# Patient Record
Sex: Female | Born: 1985
Health system: Southern US, Community
[De-identification: ages and names within clinical notes are randomized; demographics above are authoritative.]

## PROBLEM LIST (undated history)

## (undated) DIAGNOSIS — D649 Anemia, unspecified: Secondary | ICD-10-CM

## (undated) HISTORY — DX: Anemia, unspecified: D64.9

## (undated) HISTORY — PX: NO PAST SURGERIES: SHX2092

---

## 2010-10-10 ENCOUNTER — Emergency Department (HOSPITAL_BASED_OUTPATIENT_CLINIC_OR_DEPARTMENT_OTHER)
Admission: EM | Admit: 2010-10-10 | Discharge: 2010-10-10 | Disposition: A | Discharge status: Home / Self Care | Payer: Self-pay | Attending: Emergency Medicine | Admitting: Emergency Medicine

## 2010-10-10 ENCOUNTER — Encounter: Payer: Self-pay | Admitting: *Deleted

## 2010-10-10 DIAGNOSIS — L259 Unspecified contact dermatitis, unspecified cause: Secondary | ICD-10-CM | POA: Insufficient documentation

## 2010-10-10 DIAGNOSIS — F17200 Nicotine dependence, unspecified, uncomplicated: Secondary | ICD-10-CM | POA: Insufficient documentation

## 2010-10-10 MED ORDER — PREDNISONE 10 MG PO TABS: 20.0000 mg | ORAL_TABLET | Freq: Every day | ORAL | Status: AC

## 2010-10-10 NOTE — ED Notes (Signed)
Pt c.o rash to face x 1 day.  

## 2010-10-10 NOTE — ED Provider Notes (Signed)
History     CSN: [id number] Arrival date & time: 10/10/2010  3:36 PM  Chief Complaint  Patient presents with  . Rash    HPI  (Consider location/radiation/quality/duration/timing/severity/associated sxs/prior treatment)  HPI Despite female presents with chief complaint a rash to her face. Rash occurred yesterday. It is very itchy. Has been treated with hydrocortisone. Patient denies exposure to plants are poison ivy. No new foods soaps or detergents. History reviewed. No pertinent past medical history.  History reviewed. No pertinent past surgical history.  History reviewed. No pertinent family history.  History  Substance Use Topics  . Smoking status: Current Everyday Smoker -- 0.5 packs/day  . Smokeless tobacco: Not on file  . Alcohol Use: No    OB History    Grav Para Term Preterm Abortions TAB SAB Ect Mult Living                  Review of Systems  Review of Systems  All other systems reviewed and are negative.    Allergies  Review of patient's allergies indicates no known allergies.  Home Medications  No current outpatient prescriptions on file.  Physical Exam    BP 125/75  Pulse 64  Temp(Src) 98.1 F (36.7 C) (Oral)  Resp 16  Ht 5\' 4"  (1.626 m)  Wt 130 lb (58.968 kg)  BMI 22.31 kg/m2  SpO2 100%  LMP 09/23/2010  Physical Exam  Nursing note and vitals reviewed. Constitutional: She is oriented to person, place, and time. She appears well-developed and well-nourished. No distress.  HENT:  Head: Normocephalic and atraumatic.  Eyes: Pupils are equal, round, and reactive to light.  Neck: Normal range of motion.  Cardiovascular: Normal rate and intact distal pulses.   Pulmonary/Chest: No respiratory distress.  Abdominal: Normal appearance. She exhibits no distension.  Musculoskeletal: Normal range of motion.  Neurological: She is alert and oriented to person, place, and time. No cranial nerve deficit.  Skin: Skin is warm and dry. Rash noted.   Rash consistent with a contact dermatitis. No evidence of infection or or cellulitis.  Psychiatric: She has a normal mood and affect. Her behavior is normal.    ED Course  Procedures (including critical care time)  Labs Reviewed - No data to display No results found.   No diagnosis found.   MDM         Nelia Shi, MD 10/10/10 [phone]

## 2011-01-20 NOTE — L&D Delivery Note (Signed)
Delivery Note   Ophelia, Sipe [[id number]]  At 6:59 AM a viable and healthy female was delivered via Vaginal, Spontaneous Delivery (Presentation: OA  ). Vigorous to awaiting RN at mother request APGAR: 9, 9; weight .   Placenta status: intact, by Veatrice Kells  Cord: 3 vessels with the following complications: None.  Anesthesia: Epidural  Episiotomy: None Lacerations: None Suture Repair: n/a Est. Blood Loss (mL): 350    Eran, Windish [place] [[id number]]  At 7:09 AM a viable and healthy female was delivered via Vaginal, Breech (Presentation: double footling ).  APGAR: 7, 8; weight .   Placenta status: Intact, Spontaneous by Veatrice Kells.  Cord: 3 vessels with the following complications: None.   Anesthesia: Epidural  Episiotomy: None Lacerations: None Suture Repair: None Est. Blood Loss (mL): 350   Mom to postpartum.   Baby A to nursery-stable.   Baby B to nursery-stable.  Dr. Emelda Fear present at delivery.  Jann Milkovich E. 04/27/2011, 7:29 AM

## 2011-03-23 LAB — RPR: RPR: NONREACTIVE

## 2011-03-23 LAB — GLUCOSE TOLERANCE, 1 HOUR: Glucose, 1 hour: 77

## 2011-03-23 LAB — CBC
HCT: 30 % — AB (ref 36–46)
Hemoglobin: 10 g/dL — AB (ref 12.0–16.0)

## 2011-03-23 LAB — CYTOLOGY - PAP: CYTOLOGY - PAP: NEGATIVE

## 2011-03-24 LAB — HIV ANTIBODY (ROUTINE TESTING W REFLEX): HIV: NONREACTIVE

## 2011-03-24 LAB — ANTIBODY SCREEN: Antibody Screen: NEGATIVE

## 2011-03-24 LAB — ABO/RH: RH Type: POSITIVE

## 2011-03-31 DIAGNOSIS — O0930 Supervision of pregnancy with insufficient antenatal care, unspecified trimester: Secondary | ICD-10-CM

## 2011-03-31 DIAGNOSIS — O30009 Twin pregnancy, unspecified number of placenta and unspecified number of amniotic sacs, unspecified trimester: Secondary | ICD-10-CM

## 2011-04-02 ENCOUNTER — Ambulatory Visit (INDEPENDENT_AMBULATORY_CARE_PROVIDER_SITE_OTHER): Payer: Medicaid Other | Admitting: Obstetrics and Gynecology

## 2011-04-02 VITALS — BP 116/78 | Temp 96.7°F | Ht 63.5 in | Wt 156.0 lb

## 2011-04-02 DIAGNOSIS — O0993 Supervision of high risk pregnancy, unspecified, third trimester: Secondary | ICD-10-CM

## 2011-04-02 DIAGNOSIS — O30009 Twin pregnancy, unspecified number of placenta and unspecified number of amniotic sacs, unspecified trimester: Secondary | ICD-10-CM

## 2011-04-02 DIAGNOSIS — O0930 Supervision of pregnancy with insufficient antenatal care, unspecified trimester: Secondary | ICD-10-CM

## 2011-04-02 LAB — POCT URINALYSIS DIP (DEVICE)
Hgb urine dipstick: NEGATIVE
Nitrite: NEGATIVE
Protein, ur: NEGATIVE mg/dL
pH: 6 (ref 5.0–8.0)

## 2011-04-02 NOTE — Progress Notes (Signed)
U/S 04/07/11 at 230 pm.

## 2011-04-02 NOTE — Progress Notes (Signed)
Pulse 105 Pt reports that she has had all labs at health department including 1hr glucose, pap smear and cultures.

## 2011-04-02 NOTE — Progress Notes (Incomplete)
Nutrition Note:  (1st clinic visit) Pt seen for Nutrition consult for initial [facility] visit. Pt dx. Twin gestation, smoker, anemia, poor weight gain, hx of asthma and late [facility]. Pt is [redacted]w[redacted]d gestation with inadequate wt gain of 21#, plots 9#< expected at 34w. Pt reports poor intake of 2-3 small meals daily, no N/V or food allergies reported.  Pt also reports that she sleeps a lot.  Disc wt gain goals of 37-54#.  Encouraged increasing intake by adding 2-3 small snacks.  Pt does not plan to breastfeed and does not receive [facility] services currently.  Made WIC appt for 04/14/11 @ 1:15. Follow up if referred.  Cy Blamer, RD

## 2011-04-02 NOTE — Progress Notes (Signed)
Patient doing well without complaints. Understands why she is transferred to [facility]. Risk of twin pregnancy explained. Awaiting lab results from health department. Will schedule anatomy ultrasound today. Patient planning on BTL for pp BCM. FM/PTL precautions reviewed.

## 2011-04-02 NOTE — Patient Instructions (Signed)
Multiple Pregnancy A multiple pregnancy is when a woman is pregnant with two or more fetuses. Multiple pregnancies occur in about 3% of all births. The more babies in a pregnancy, the greater the risks of problems to the babies and mother. This includes death. Since the use of Assisted Reproductive Technology (ART) and medications that can induce ovulation, multiple fetal gestation has increased.  RISKS TO THE MOTHER  Preeclampsia and eclampsia.   Postpartum bleeding (hemorrhage).   Kidney infection (pyelonephritis).   Develop anemia.   Develop diabetes.   Liver complications.   A blood clot blocks the artery, or branch of the artery leading to the lungs (pulmonary embolism).   Blood clots in the leg.   Placental separation.   Higher rate of Cesarean Section deliveries.   Women over 35 years old have a higher rate of Downs Syndrome babies.  RISKS TO THE BABY  Preterm labor with a premature baby.   Very low birth weight babies that are less than 3 pounds, especially with triplets or mores.   Premature rupture of the membranes.   Twin to twin blood transfusion with one baby anemic and the other baby with too much blood in its system. There may also be heart failure.   With triplets or more, one of the babies is at high risk for cerebral palsy or other neurologic problem.   There is a higher incidence of fetal death.  CARE OF MOTHERS WITH MULTIPLE FETAL GESTATION Multiple pregnancies need more care and special prenatal care.  You will see your caregiver more often.   You will have more tests including ultrasounds, nonstress tests and blood tests.   You will have special tests done called amniocentesis and a biophysical profile.   You may be hospitalized more often during the pregnancy.   You will be encouraged to eat a balanced and healthy diet with vitamin and mineral supplements as directed.   You will be asked to get more rest and sleep to keep up your energy.    You will be asked to restrict your daily activities, exercise, work, household chores and sexual activity.   If you have preterm labor with small babies, you will be given a steroid injection to help the babies lungs mature and do better when born.   The delivery may have to be by Cesarean delivery, especially if there are triplets or more.   The delivery should be in a hospital with an intensive care nursery and Neonatologists (pediatrician for high risk babies) to care for the newborn babies.  HOME CARE INSTRUCTIONS   Follow the caregiver's recommendations regarding office visits, tests for you and the babies, diet, rest and medications.   Avoid a large amount of physical activity.   Arrange to have help after the babies are born and when you go home from the hospital.   Take classes on how to care for multiple babies before you deliver them.  SEEK IMMEDIATE MEDICAL CARE IF:   You develop a temperature of 100.4 F (38 C) or higher.   You are leaking fluid from the vagina.   You develop vaginal bleeding.   You develop uterine contractions.   You develop a severe headache, severe upper abdominal pain, visual problems or excessive swelling of your face, hands and feet.   You develop severe back pain or leg pain.   You develop severe tiredness.   You develop chest pain.   You have shortness of breath, fall down or pass out.    Document Released: 10/15/2007 Document Revised: 12/25/2010 Document Reviewed: 10/15/2007 Exit[employer] Patient Information 2012 [employer]. 

## 2011-04-06 ENCOUNTER — Ambulatory Visit (INDEPENDENT_AMBULATORY_CARE_PROVIDER_SITE_OTHER): Payer: Medicaid Other | Admitting: *Deleted

## 2011-04-06 VITALS — BP 116/65 | Wt 162.2 lb

## 2011-04-06 DIAGNOSIS — O0930 Supervision of pregnancy with insufficient antenatal care, unspecified trimester: Secondary | ICD-10-CM

## 2011-04-06 DIAGNOSIS — O30009 Twin pregnancy, unspecified number of placenta and unspecified number of amniotic sacs, unspecified trimester: Secondary | ICD-10-CM

## 2011-04-06 LAB — FETAL NONSTRESS TEST

## 2011-04-06 NOTE — Progress Notes (Signed)
P = 102   Pt is scheduled for growth/anatomy US tomorrow.

## 2011-04-07 ENCOUNTER — Ambulatory Visit (HOSPITAL_COMMUNITY)
Admission: RE | Admit: 2011-04-07 | Discharge: 2011-04-07 | Disposition: A | Discharge status: Home / Self Care | Payer: Medicaid Other | Source: Ambulatory Visit | Attending: Obstetrics and Gynecology | Admitting: Obstetrics and Gynecology

## 2011-04-07 ENCOUNTER — Other Ambulatory Visit: Payer: Self-pay | Admitting: Obstetrics and Gynecology

## 2011-04-07 DIAGNOSIS — Z363 Encounter for antenatal screening for malformations: Secondary | ICD-10-CM | POA: Insufficient documentation

## 2011-04-07 DIAGNOSIS — O30009 Twin pregnancy, unspecified number of placenta and unspecified number of amniotic sacs, unspecified trimester: Secondary | ICD-10-CM

## 2011-04-07 DIAGNOSIS — O0930 Supervision of pregnancy with insufficient antenatal care, unspecified trimester: Secondary | ICD-10-CM

## 2011-04-07 DIAGNOSIS — Z1389 Encounter for screening for other disorder: Secondary | ICD-10-CM | POA: Insufficient documentation

## 2011-04-07 DIAGNOSIS — O358XX0 Maternal care for other (suspected) fetal abnormality and damage, not applicable or unspecified: Secondary | ICD-10-CM | POA: Insufficient documentation

## 2011-04-09 ENCOUNTER — Ambulatory Visit (INDEPENDENT_AMBULATORY_CARE_PROVIDER_SITE_OTHER): Payer: Medicaid Other | Admitting: Obstetrics and Gynecology

## 2011-04-09 ENCOUNTER — Other Ambulatory Visit: Payer: Medicaid Other

## 2011-04-09 VITALS — BP 113/70 | Temp 97.6°F | Wt 161.6 lb

## 2011-04-09 DIAGNOSIS — O0993 Supervision of high risk pregnancy, unspecified, third trimester: Secondary | ICD-10-CM

## 2011-04-09 DIAGNOSIS — O30009 Twin pregnancy, unspecified number of placenta and unspecified number of amniotic sacs, unspecified trimester: Secondary | ICD-10-CM

## 2011-04-09 DIAGNOSIS — O0930 Supervision of pregnancy with insufficient antenatal care, unspecified trimester: Secondary | ICD-10-CM

## 2011-04-09 LAB — POCT URINALYSIS DIP (DEVICE)
Hgb urine dipstick: NEGATIVE
Nitrite: NEGATIVE
Urobilinogen, UA: 0.2 mg/dL (ref 0.0–1.0)
pH: 6.5 (ref 5.0–8.0)

## 2011-04-09 LAB — FETAL NONSTRESS TEST

## 2011-04-09 NOTE — Progress Notes (Signed)
Patient c/o cramping pain. cx 1/long/posterior/ moderate tone. Limited anatomy due to advance gestational age but otherwise normal visualized anatomy. FM/PTL precautions reviewed

## 2011-04-09 NOTE — Progress Notes (Signed)
Pulse: 99 

## 2011-04-10 ENCOUNTER — Encounter: Payer: Self-pay | Admitting: *Deleted

## 2011-04-13 ENCOUNTER — Other Ambulatory Visit: Payer: Self-pay | Admitting: Physician Assistant

## 2011-04-13 ENCOUNTER — Ambulatory Visit (INDEPENDENT_AMBULATORY_CARE_PROVIDER_SITE_OTHER): Payer: Medicaid Other | Admitting: *Deleted

## 2011-04-13 VITALS — BP 104/67 | Wt 157.3 lb

## 2011-04-13 DIAGNOSIS — O30009 Twin pregnancy, unspecified number of placenta and unspecified number of amniotic sacs, unspecified trimester: Secondary | ICD-10-CM

## 2011-04-13 DIAGNOSIS — G4700 Insomnia, unspecified: Secondary | ICD-10-CM

## 2011-04-13 LAB — FETAL NONSTRESS TEST

## 2011-04-13 MED ORDER — ZOLPIDEM TARTRATE 10 MG PO TABS: 10.0000 mg | ORAL_TABLET | Freq: Every evening | ORAL | Status: AC | PRN

## 2011-04-13 NOTE — Progress Notes (Addendum)
P = 125  Pt reports pain in lower abdomen and requesting cervical exam today - performed by Maylon Cos CNM.  Labor sx reviewed.  NST reactive

## 2011-04-14 ENCOUNTER — Encounter: Payer: Self-pay | Admitting: Obstetrics and Gynecology

## 2011-04-16 ENCOUNTER — Ambulatory Visit (INDEPENDENT_AMBULATORY_CARE_PROVIDER_SITE_OTHER): Payer: Medicaid Other | Admitting: Obstetrics & Gynecology

## 2011-04-16 VITALS — BP 110/67 | Temp 97.1°F | Wt 159.4 lb

## 2011-04-16 DIAGNOSIS — O30009 Twin pregnancy, unspecified number of placenta and unspecified number of amniotic sacs, unspecified trimester: Secondary | ICD-10-CM

## 2011-04-16 LAB — POCT URINALYSIS DIP (DEVICE)
Glucose, UA: NEGATIVE mg/dL
Hgb urine dipstick: NEGATIVE
Protein, ur: NEGATIVE mg/dL
Specific Gravity, Urine: 1.015 (ref 1.005–1.030)
Urobilinogen, UA: 1 mg/dL (ref 0.0–1.0)

## 2011-04-16 NOTE — Progress Notes (Signed)
[place] on 3/20--38% and 20% (11% dis concordant).  PT for BTL.  Papers are in EPIC.  GBS today.  GC and Chlam were tested earlier this month.  NST reactive x2.

## 2011-04-16 NOTE — Progress Notes (Signed)
Addended by: Loraine Bhullar H on: 04/16/2011 12:08 PM   Modules accepted: Orders  

## 2011-04-16 NOTE — Progress Notes (Signed)
P = 125 

## 2011-04-16 NOTE — Progress Notes (Signed)
Needs gbs.

## 2011-04-16 NOTE — Patient Instructions (Signed)
Breastfeeding BENEFITS OF BREASTFEEDING For the baby  The first milk (colostrum) helps the baby's digestive system function better.   There are antibodies from the mother in the milk that help the baby fight off infections.   The baby has a lower incidence of asthma, allergies, and SIDS (sudden infant death syndrome).   The nutrients in breast milk are better than formulas for the baby and helps the baby's brain grow better.   Babies who breastfeed have less gas, colic, and constipation.  For the mother  Breastfeeding helps develop a very special bond between mother and baby.   It is more convenient, always available at the correct temperature and cheaper than formula feeding.   It burns calories in the mother and helps with losing weight that was gained during pregnancy.   It makes the uterus contract back down to normal size faster and slows bleeding following delivery.   Breastfeeding mothers have a lower risk of developing breast cancer.  NURSE FREQUENTLY  A healthy, full-term baby may breastfeed as often as every hour or space his or her feedings to every 3 hours.   How often to nurse will vary from baby to baby. Watch your baby for signs of hunger, not the clock.   Nurse as often as the baby requests, or when you feel the need to reduce the fullness of your breasts.   Awaken the baby if it has been 3 to 4 hours since the last feeding.   Frequent feeding will help the mother make more milk and will prevent problems like sore nipples and engorgement of the breasts.  BABY'S POSITION AT THE BREAST  Whether lying down or sitting, be sure that the baby's tummy is facing your tummy.   Support the breast with 4 fingers underneath the breast and the thumb above. Make sure your fingers are well away from the nipple and baby's mouth.   Stroke the baby's lips and cheek closest to the breast gently with your finger or nipple.   When the baby's mouth is open wide enough, place all  of your nipple and as much of the dark area around the nipple as possible into your baby's mouth.   Pull the baby in close so the tip of the nose and the baby's cheeks touch the breast during the feeding.  FEEDINGS  The length of each feeding varies from baby to baby and from feeding to feeding.   The baby must suck about 2 to 3 minutes for your milk to get to him or her. This is called a "let down." For this reason, allow the baby to feed on each breast as long as he or she wants. Your baby will end the feeding when he or she has received the right balance of nutrients.   To break the suction, put your finger into the corner of the baby's mouth and slide it between his or her gums before removing your breast from his or her mouth. This will help prevent sore nipples.  REDUCING BREAST ENGORGEMENT  In the first week after your baby is born, you may experience signs of breast engorgement. When breasts are engorged, they feel heavy, warm, full, and may be tender to the touch. You can reduce engorgement if you:   Nurse frequently, every 2 to 3 hours. Mothers who breastfeed early and often have fewer problems with engorgement.   Place light ice packs on your breasts between feedings. This reduces swelling. Wrap the ice packs in a   lightweight towel to protect your skin.   Apply moist hot packs to your breast for 5 to 10 minutes before each feeding. This increases circulation and helps the milk flow.   Gently massage your breast before and during the feeding.   Make sure that the baby empties at least one breast at every feeding before switching sides.   Use a breast pump to empty the breasts if your baby is sleepy or not nursing well. You may also want to pump if you are returning to work or or you feel you are getting engorged.   Avoid bottle feeds, pacifiers or supplemental feedings of water or juice in place of breastfeeding.   Be sure the baby is latched on and positioned properly while  breastfeeding.   Prevent fatigue, stress, and anemia.   Wear a supportive bra, avoiding underwire styles.   Eat a balanced diet with enough fluids.  If you follow these suggestions, your engorgement should improve in 24 to 48 hours. If you are still experiencing difficulty, call your lactation consultant or caregiver. IS MY BABY GETTING ENOUGH MILK? Sometimes, mothers worry about whether their babies are getting enough milk. You can be assured that your baby is getting enough milk if:  The baby is actively sucking and you hear swallowing.   The baby nurses at least 8 to 12 times in a 24 hour time period. Nurse your baby until he or she unlatches or falls asleep at the first breast (at least 10 to 20 minutes), then offer the second side.   The baby is wetting 5 to 6 disposable diapers (6 to 8 cloth diapers) in a 24 hour period by 5 to 6 days of age.   The baby is having at least 2 to 3 stools every 24 hours for the first few months. Breast milk is all the food your baby needs. It is not necessary for your baby to have water or formula. In fact, to help your breasts make more milk, it is best not to give your baby supplemental feedings during the early weeks.   The stool should be soft and yellow.   The baby should gain 4 to 7 ounces per week after he is 4 days old.  TAKE CARE OF YOURSELF Take care of your breasts by:  Bathing or showering daily.   Avoiding the use of soaps on your nipples.   Start feedings on your left breast at one feeding and on your right breast at the next feeding.   You will notice an increase in your milk supply 2 to 5 days after delivery. You may feel some discomfort from engorgement, which makes your breasts very firm and often tender. Engorgement "peaks" out within 24 to 48 hours. In the meantime, apply warm moist towels to your breasts for 5 to 10 minutes before feeding. Gentle massage and expression of some milk before feeding will soften your breasts, making  it easier for your baby to latch on. Wear a well fitting nursing bra and air dry your nipples for 10 to 15 minutes after each feeding.   Only use cotton bra pads.   Only use pure lanolin on your nipples after nursing. You do not need to wash it off before nursing.  Take care of yourself by:   Eating well-balanced meals and nutritious snacks.   Drinking milk, fruit juice, and water to satisfy your thirst (about 8 glasses a day).   Getting plenty of rest.   Increasing calcium in   your diet (1200 mg a day).   Avoiding foods that you notice affect the baby in a bad way.  SEEK MEDICAL CARE IF:   You have any questions or difficulty with breastfeeding.   You need help.   You have a hard, red, sore area on your breast, accompanied by a fever of 100.5 F (38.1 C) or more.   Your baby is too sleepy to eat well or is having trouble sleeping.   Your baby is wetting less than 6 diapers per day, by 5 days of age.   Your baby's skin or white part of his or her eyes is more yellow than it was in the hospital.   You feel depressed.  Document Released: 01/05/2005 Document Revised: 12/25/2010 Document Reviewed: 08/20/2008 Exit[employer] Patient Information 2012 [employer]. 

## 2011-04-20 ENCOUNTER — Ambulatory Visit (INDEPENDENT_AMBULATORY_CARE_PROVIDER_SITE_OTHER): Payer: Self-pay | Admitting: *Deleted

## 2011-04-20 VITALS — BP 111/62 | Wt 163.5 lb

## 2011-04-20 DIAGNOSIS — O30009 Twin pregnancy, unspecified number of placenta and unspecified number of amniotic sacs, unspecified trimester: Secondary | ICD-10-CM

## 2011-04-20 LAB — CULTURE, BETA STREP (GROUP B ONLY)

## 2011-04-20 NOTE — Progress Notes (Signed)
P=99 

## 2011-04-20 NOTE — Progress Notes (Signed)
NST performed on 04/20/2011 was reviewed and was found to be reactive x 2 .  Continue recommended antenatal testing and prenatal care.

## 2011-04-23 ENCOUNTER — Encounter: Payer: Self-pay | Admitting: Advanced Practice Midwife

## 2011-04-23 ENCOUNTER — Ambulatory Visit (INDEPENDENT_AMBULATORY_CARE_PROVIDER_SITE_OTHER): Payer: Self-pay | Admitting: Family Medicine

## 2011-04-23 VITALS — BP 129/86 | Temp 97.3°F | Wt 162.7 lb

## 2011-04-23 DIAGNOSIS — O30009 Twin pregnancy, unspecified number of placenta and unspecified number of amniotic sacs, unspecified trimester: Secondary | ICD-10-CM

## 2011-04-23 LAB — POCT URINALYSIS DIP (DEVICE)
Bilirubin Urine: NEGATIVE
Glucose, UA: NEGATIVE mg/dL
Ketones, ur: NEGATIVE mg/dL
Leukocytes, UA: NEGATIVE
pH: 6.5 (ref 5.0–8.0)

## 2011-04-23 NOTE — Progress Notes (Signed)
NST reviewed and reactive x 2. ? In labor, will walk and recheck in 1 hour Schedule IOL if not in labor

## 2011-04-23 NOTE — Progress Notes (Signed)
Pt reports increased strength of UC's since 2130 last night- requests cervical exam.

## 2011-04-23 NOTE — Progress Notes (Signed)
Pelvic pressure. No vaginal discharge.  

## 2011-04-23 NOTE — Patient Instructions (Addendum)
Normal Labor and Delivery Your caregiver must first be sure you are in labor. Signs of labor include:  You may pass what is called "the mucus plug" before labor begins. This is a small amount of blood stained mucus.   Regular uterine contractions.   The time between contractions get closer together.   The discomfort and pain gradually gets more intense.   Pains are mostly located in the back.   Pains get worse when walking.   The cervix (the opening of the uterus becomes thinner (begins to efface) and opens up (dilates).  Once you are in labor and admitted into the hospital or care center, your caregiver will do the following:  A complete physical examination.   Check your vital signs (blood pressure, pulse, temperature and the fetal heart rate).   Do a vaginal examination (using a sterile glove and lubricant) to determine:   The position (presentation) of the baby (head [vertex] or buttock first).   The level (station) of the baby's head in the birth canal.   The effacement and dilatation of the cervix.   You may have your pubic hair shaved and be given an enema depending on your caregiver and the circumstance.   An electronic monitor is usually placed on your abdomen. The monitor follows the length and intensity of the contractions, as well as the baby's heart rate.   Usually, your caregiver will insert an IV in your arm with a bottle of sugar water. This is done as a precaution so that medications can be given to you quickly during labor or delivery.  NORMAL LABOR AND DELIVERY IS DIVIDED UP INTO 3 STAGES: First Stage This is when regular contractions begin and the cervix begins to efface and dilate. This stage can last from 3 to 15 hours. The end of the first stage is when the cervix is 100% effaced and 10 centimeters dilated. Pain medications may be given by   Injection (morphine, demerol, etc.)   Regional anesthesia (spinal, caudal or epidural, anesthetics given in  different locations of the spine). Paracervical pain medication may be given, which is an injection of and anesthetic on each side of the cervix.  A pregnant woman may request to have "Natural Childbirth" which is not to have any medications or anesthesia during her labor and delivery. Second Stage This is when the baby comes down through the birth canal (vagina) and is born. This can take 1 to 4 hours. As the baby's head comes down through the birth canal, you may feel like you are going to have a bowel movement. You will get the urge to bear down and push until the baby is delivered. As the baby's head is being delivered, the caregiver will decide if an episiotomy (a cut in the perineum and vagina area) is needed to prevent tearing of the tissue in this area. The episiotomy is sewn up after the delivery of the baby and placenta. Sometimes a mask with nitrous oxide is given for the mother to breath during the delivery of the baby to help if there is too much pain. The end of Stage 2 is when the baby is fully delivered. Then when the umbilical cord stops pulsating it is clamped and cut. Third Stage The third stage begins after the baby is completely delivered and ends after the placenta (afterbirth) is delivered. This usually takes 5 to 30 minutes. After the placenta is delivered, a medication is given either by intravenous or injection to help contract   the uterus and prevent bleeding. The third stage is not painful and pain medication is usually not necessary. If an episiotomy was done, it is repaired at this time. After the delivery, the mother is watched and monitored closely for 1 to 2 hours to make sure there is no postpartum bleeding (hemorrhage). If there is a lot of bleeding, medication is given to contract the uterus and stop the bleeding. Document Released: 10/15/2007 Document Revised: 12/25/2010 Document Reviewed: 10/15/2007 ExitCare Patient Information 2012 [employer]. Breastfeeding BENEFITS OF BREASTFEEDING For the baby  The first milk (colostrum) helps the baby's digestive system function better.   There are antibodies from the mother in the milk that help the baby fight off infections.   The baby has a lower incidence of asthma, allergies, and SIDS (sudden infant death syndrome).   The nutrients in breast milk are better than formulas for the baby and helps the baby's brain grow better.   Babies who breastfeed have less gas, colic, and constipation.  For the mother  Breastfeeding helps develop a very special bond between mother and baby.   It is more convenient, always available at the correct temperature and cheaper than formula feeding.   It burns calories in the mother and helps with losing weight that was gained during pregnancy.   It makes the uterus contract back down to normal size faster and slows bleeding following delivery.   Breastfeeding mothers have a lower risk of developing breast cancer.  NURSE FREQUENTLY  A healthy, full-term baby may breastfeed as often as every hour or space his or her feedings to every 3 hours.   How often to nurse will vary from baby to baby. Watch your baby for signs of hunger, not the clock.   Nurse as often as the baby requests, or when you feel the need to reduce the fullness of your breasts.   Awaken the baby if it has been 3 to 4 hours since the last feeding.   Frequent feeding will help the mother make more milk and will prevent problems like sore nipples and engorgement of the breasts.  BABY'S POSITION AT THE BREAST  Whether lying down or sitting, be sure that the baby's tummy is facing your tummy.   Support the breast with 4 fingers underneath the breast and the thumb above. Make sure your fingers are well away from the nipple and baby's mouth.   Stroke the baby's lips and cheek closest to the breast gently with your finger or nipple.   When the baby's mouth is open wide enough,  place all of your nipple and as much of the dark area around the nipple as possible into your baby's mouth.   Pull the baby in close so the tip of the nose and the baby's cheeks touch the breast during the feeding.  FEEDINGS  The length of each feeding varies from baby to baby and from feeding to feeding.   The baby must suck about 2 to 3 minutes for your milk to get to him or her. This is called a "let down." For this reason, allow the baby to feed on each breast as long as he or she wants. Your baby will end the feeding when he or she has received the right balance of nutrients.   To break the suction, put your finger into the corner of the baby's mouth and slide it between his or her gums before removing your breast from his or her mouth. This will   help prevent sore nipples.  REDUCING BREAST ENGORGEMENT  In the first week after your baby is born, you may experience signs of breast engorgement. When breasts are engorged, they feel heavy, warm, full, and may be tender to the touch. You can reduce engorgement if you:   Nurse frequently, every 2 to 3 hours. Mothers who breastfeed early and often have fewer problems with engorgement.   Place light ice packs on your breasts between feedings. This reduces swelling. Wrap the ice packs in a lightweight towel to protect your skin.   Apply moist hot packs to your breast for 5 to 10 minutes before each feeding. This increases circulation and helps the milk flow.   Gently massage your breast before and during the feeding.   Make sure that the baby empties at least one breast at every feeding before switching sides.   Use a breast pump to empty the breasts if your baby is sleepy or not nursing well. You may also want to pump if you are returning to work or or you feel you are getting engorged.   Avoid bottle feeds, pacifiers or supplemental feedings of water or juice in place of breastfeeding.   Be sure the baby is latched on and positioned properly  while breastfeeding.   Prevent fatigue, stress, and anemia.   Wear a supportive bra, avoiding underwire styles.   Eat a balanced diet with enough fluids.  If you follow these suggestions, your engorgement should improve in 24 to 48 hours. If you are still experiencing difficulty, call your lactation consultant or caregiver. IS MY BABY GETTING ENOUGH MILK? Sometimes, mothers worry about whether their babies are getting enough milk. You can be assured that your baby is getting enough milk if:  The baby is actively sucking and you hear swallowing.   The baby nurses at least 8 to 12 times in a 24 hour time period. Nurse your baby until he or she unlatches or falls asleep at the first breast (at least 10 to 20 minutes), then offer the second side.   The baby is wetting 5 to 6 disposable diapers (6 to 8 cloth diapers) in a 24 hour period by 5 to 6 days of age.   The baby is having at least 2 to 3 stools every 24 hours for the first few months. Breast milk is all the food your baby needs. It is not necessary for your baby to have water or formula. In fact, to help your breasts make more milk, it is best not to give your baby supplemental feedings during the early weeks.   The stool should be soft and yellow.   The baby should gain 4 to 7 ounces per week after he is 4 days old.  TAKE CARE OF YOURSELF Take care of your breasts by:  Bathing or showering daily.   Avoiding the use of soaps on your nipples.   Start feedings on your left breast at one feeding and on your right breast at the next feeding.   You will notice an increase in your milk supply 2 to 5 days after delivery. You may feel some discomfort from engorgement, which makes your breasts very firm and often tender. Engorgement "peaks" out within 24 to 48 hours. In the meantime, apply warm moist towels to your breasts for 5 to 10 minutes before feeding. Gentle massage and expression of some milk before feeding will soften your breasts,  making it easier for your baby to latch on. Wear a   well fitting nursing bra and air dry your nipples for 10 to 15 minutes after each feeding.   Only use cotton bra pads.   Only use pure lanolin on your nipples after nursing. You do not need to wash it off before nursing.  Take care of yourself by:   Eating well-balanced meals and nutritious snacks.   Drinking milk, fruit juice, and water to satisfy your thirst (about 8 glasses a day).   Getting plenty of rest.   Increasing calcium in your diet (1200 mg a day).   Avoiding foods that you notice affect the baby in a bad way.  SEEK MEDICAL CARE IF:   You have any questions or difficulty with breastfeeding.   You need help.   You have a hard, red, sore area on your breast, accompanied by a fever of 100.5 F (38.1 C) or more.   Your baby is too sleepy to eat well or is having trouble sleeping.   Your baby is wetting less than 6 diapers per day, by 5 days of age.   Your baby's skin or white part of his or her eyes is more yellow than it was in the hospital.   You feel depressed.  Document Released: 01/05/2005 Document Revised: 12/25/2010 Document Reviewed: 08/20/2008 ExitCare Patient Information 2012 [employer]. 

## 2011-04-23 NOTE — Progress Notes (Signed)
Sent walking. Returned at [employer]. Minimal change in VE. Trickle of fluid seen on perineum. Fern neg. Sent home w/ labor precautions.

## 2011-04-24 ENCOUNTER — Telehealth: Payer: Self-pay | Admitting: *Deleted

## 2011-04-24 NOTE — Telephone Encounter (Signed)
Received call transferred from front desk. Patient called stating she was here yesterday and had her cervix checked twice and last night had a little vaginal bleeding, but none since. States this am has been having mucousy discharge. Denies any further bleeding, denies any vaginal itching/discomfort, denies mal odor to discharge. Instructed patient  A little bleeding after cervix checks is normal, but if she has any further bleeding to come to MAU. Also discussed if vaginal discharge becomes malodorous, has discomfort or changes color to come to mau. Also discussed if has any leakage of water like fluid, or baby not moving normally come to hospital to MAU.  Patient states still having contractrions about every 8 minutes, discussed to come to hospital when contractions are 5 minutes apart or too painful . Patient voices understanding.

## 2011-04-27 ENCOUNTER — Encounter (HOSPITAL_COMMUNITY): Payer: Self-pay | Admitting: *Deleted

## 2011-04-27 ENCOUNTER — Inpatient Hospital Stay (HOSPITAL_COMMUNITY)
Admission: AD | Admit: 2011-04-27 | Discharge: 2011-04-29 | DRG: 767 | Disposition: A | Discharge status: Home / Self Care | Payer: Self-pay | Attending: Obstetrics and Gynecology | Admitting: Obstetrics and Gynecology

## 2011-04-27 ENCOUNTER — Encounter (HOSPITAL_COMMUNITY): Payer: Self-pay | Admitting: Anesthesiology

## 2011-04-27 ENCOUNTER — Inpatient Hospital Stay (HOSPITAL_COMMUNITY): Payer: Self-pay | Admitting: Anesthesiology

## 2011-04-27 ENCOUNTER — Other Ambulatory Visit: Payer: Self-pay

## 2011-04-27 DIAGNOSIS — O30009 Twin pregnancy, unspecified number of placenta and unspecified number of amniotic sacs, unspecified trimester: Principal | ICD-10-CM | POA: Diagnosis present

## 2011-04-27 DIAGNOSIS — O0930 Supervision of pregnancy with insufficient antenatal care, unspecified trimester: Secondary | ICD-10-CM

## 2011-04-27 DIAGNOSIS — Z302 Encounter for sterilization: Secondary | ICD-10-CM

## 2011-04-27 DIAGNOSIS — O30049 Twin pregnancy, dichorionic/diamniotic, unspecified trimester: Secondary | ICD-10-CM

## 2011-04-27 DIAGNOSIS — O0940 Supervision of pregnancy with grand multiparity, unspecified trimester: Secondary | ICD-10-CM

## 2011-04-27 DIAGNOSIS — O0993 Supervision of high risk pregnancy, unspecified, third trimester: Secondary | ICD-10-CM

## 2011-04-27 LAB — CBC
HCT: 30.7 % — ABNORMAL LOW (ref 36.0–46.0)
Hemoglobin: 10.1 g/dL — ABNORMAL LOW (ref 12.0–15.0)
MCH: 28.7 pg (ref 26.0–34.0)
MCV: 87.2 fL (ref 78.0–100.0)
RBC: 3.52 MIL/uL — ABNORMAL LOW (ref 3.87–5.11)

## 2011-04-27 MED ORDER — ONDANSETRON HCL 4 MG/2ML IJ SOLN: 4.0000 mg | INTRAMUSCULAR | Status: DC | PRN

## 2011-04-27 MED ORDER — DIBUCAINE 1 % RE OINT: 1.0000 "application " | TOPICAL_OINTMENT | RECTAL | Status: DC | PRN

## 2011-04-27 MED ORDER — OXYCODONE-ACETAMINOPHEN 5-325 MG PO TABS: 1.0000 | ORAL_TABLET | ORAL | Status: DC | PRN

## 2011-04-27 MED ORDER — LIDOCAINE HCL (PF) 1 % IJ SOLN
INTRAMUSCULAR | Status: DC | PRN
  Administered 2011-04-27: 3 mL
  Administered 2011-04-27: 4 mL

## 2011-04-27 MED ORDER — SENNOSIDES-DOCUSATE SODIUM 8.6-50 MG PO TABS
2.0000 | ORAL_TABLET | Freq: Every day | ORAL | Status: DC
  Administered 2011-04-27 – 2011-04-28 (×2): 2 via ORAL

## 2011-04-27 MED ORDER — WITCH HAZEL-GLYCERIN EX PADS: 1.0000 "application " | MEDICATED_PAD | CUTANEOUS | Status: DC | PRN

## 2011-04-27 MED ORDER — DIPHENHYDRAMINE HCL 25 MG PO CAPS: 25.0000 mg | ORAL_CAPSULE | Freq: Four times a day (QID) | ORAL | Status: DC | PRN

## 2011-04-27 MED ORDER — ONDANSETRON HCL 4 MG/2ML IJ SOLN: 4.0000 mg | Freq: Four times a day (QID) | INTRAMUSCULAR | Status: DC | PRN

## 2011-04-27 MED ORDER — FLEET ENEMA 7-19 GM/118ML RE ENEM: 1.0000 | ENEMA | RECTAL | Status: DC | PRN

## 2011-04-27 MED ORDER — IBUPROFEN 600 MG PO TABS: 600.0000 mg | ORAL_TABLET | Freq: Four times a day (QID) | ORAL | Status: DC | PRN

## 2011-04-27 MED ORDER — EPHEDRINE 5 MG/ML INJ
10.0000 mg | INTRAVENOUS | Status: DC | PRN
  Filled 2011-04-27: qty 4
  Filled 2011-04-27: qty 2

## 2011-04-27 MED ORDER — LACTATED RINGERS IV SOLN: 500.0000 mL | Freq: Once | INTRAVENOUS | Status: DC

## 2011-04-27 MED ORDER — FENTANYL 2.5 MCG/ML BUPIVACAINE 1/10 % EPIDURAL INFUSION (WH - ANES)
INTRAMUSCULAR | Status: DC | PRN
  Administered 2011-04-27: 12 mL/h via EPIDURAL

## 2011-04-27 MED ORDER — LIDOCAINE HCL (PF) 1 % IJ SOLN
30.0000 mL | INTRAMUSCULAR | Status: DC | PRN
  Administered 2011-04-27: 30 mL via SUBCUTANEOUS
  Filled 2011-04-27: qty 30

## 2011-04-27 MED ORDER — PHENYLEPHRINE 40 MCG/ML (10ML) SYRINGE FOR IV PUSH (FOR BLOOD PRESSURE SUPPORT)
80.0000 ug | PREFILLED_SYRINGE | INTRAVENOUS | Status: DC | PRN
  Filled 2011-04-27: qty 5
  Filled 2011-04-27: qty 2

## 2011-04-27 MED ORDER — ONDANSETRON HCL 4 MG PO TABS: 4.0000 mg | ORAL_TABLET | ORAL | Status: DC | PRN

## 2011-04-27 MED ORDER — FAMOTIDINE 20 MG PO TABS
40.0000 mg | ORAL_TABLET | Freq: Once | ORAL | Status: AC
  Administered 2011-04-28: 40 mg via ORAL
  Filled 2011-04-27: qty 2

## 2011-04-27 MED ORDER — TETANUS-DIPHTH-ACELL PERTUSSIS 5-2.5-18.5 LF-MCG/0.5 IM SUSP
0.5000 mL | Freq: Once | INTRAMUSCULAR | Status: DC
  Filled 2011-04-27: qty 0.5

## 2011-04-27 MED ORDER — METOCLOPRAMIDE HCL 10 MG PO TABS
10.0000 mg | ORAL_TABLET | Freq: Once | ORAL | Status: AC
  Administered 2011-04-28: 10 mg via ORAL
  Filled 2011-04-27: qty 1

## 2011-04-27 MED ORDER — EPHEDRINE 5 MG/ML INJ
10.0000 mg | INTRAVENOUS | Status: DC | PRN
  Filled 2011-04-27: qty 2

## 2011-04-27 MED ORDER — PRENATAL MULTIVITAMIN CH
1.0000 | ORAL_TABLET | Freq: Every day | ORAL | Status: DC
  Administered 2011-04-29: 1 via ORAL
  Filled 2011-04-27: qty 1

## 2011-04-27 MED ORDER — LACTATED RINGERS IV SOLN
INTRAVENOUS | Status: DC
  Administered 2011-04-27: 05:00:00 via INTRAVENOUS

## 2011-04-27 MED ORDER — DIPHENHYDRAMINE HCL 50 MG/ML IJ SOLN: 12.5000 mg | INTRAMUSCULAR | Status: DC | PRN

## 2011-04-27 MED ORDER — SIMETHICONE 80 MG PO CHEW: 80.0000 mg | CHEWABLE_TABLET | ORAL | Status: DC | PRN

## 2011-04-27 MED ORDER — ACETAMINOPHEN 325 MG PO TABS: 650.0000 mg | ORAL_TABLET | ORAL | Status: DC | PRN

## 2011-04-27 MED ORDER — OXYTOCIN BOLUS FROM INFUSION
500.0000 mL | Freq: Once | INTRAVENOUS | Status: DC
  Filled 2011-04-27: qty 1000
  Filled 2011-04-27: qty 500

## 2011-04-27 MED ORDER — LACTATED RINGERS IV SOLN: INTRAVENOUS | Status: DC

## 2011-04-27 MED ORDER — LACTATED RINGERS IV SOLN: 500.0000 mL | INTRAVENOUS | Status: DC | PRN

## 2011-04-27 MED ORDER — OXYTOCIN 20 UNITS IN LACTATED RINGERS INFUSION - SIMPLE
125.0000 mL/h | Freq: Once | INTRAVENOUS | Status: AC
  Administered 2011-04-27: 125 mL/h via INTRAVENOUS

## 2011-04-27 MED ORDER — FENTANYL 2.5 MCG/ML BUPIVACAINE 1/10 % EPIDURAL INFUSION (WH - ANES)
14.0000 mL/h | INTRAMUSCULAR | Status: DC
  Administered 2011-04-27: 14 mL/h via EPIDURAL
  Filled 2011-04-27 (×2): qty 60

## 2011-04-27 MED ORDER — BENZOCAINE-MENTHOL 20-0.5 % EX AERO: 1.0000 "application " | INHALATION_SPRAY | CUTANEOUS | Status: DC | PRN

## 2011-04-27 MED ORDER — PHENYLEPHRINE 40 MCG/ML (10ML) SYRINGE FOR IV PUSH (FOR BLOOD PRESSURE SUPPORT)
80.0000 ug | PREFILLED_SYRINGE | INTRAVENOUS | Status: DC | PRN
  Filled 2011-04-27: qty 2

## 2011-04-27 MED ORDER — IBUPROFEN 600 MG PO TABS
600.0000 mg | ORAL_TABLET | Freq: Four times a day (QID) | ORAL | Status: DC
  Administered 2011-04-27 – 2011-04-29 (×7): 600 mg via ORAL
  Filled 2011-04-27 (×6): qty 1

## 2011-04-27 MED ORDER — CITRIC ACID-SODIUM CITRATE 334-500 MG/5ML PO SOLN
30.0000 mL | ORAL | Status: DC | PRN
  Filled 2011-04-27: qty 15

## 2011-04-27 NOTE — H&P (Signed)
Chief Complaint: Active Labor   Amy Mcdonald is  26 y.o. [phone].  Patient's last menstrual period was 08/07/2010..[redacted]w[redacted]d by LMP  She presents complaining of active labor. Reports strong contractions since 10pm. .  Obstetrical/Gynecological History:   1 Term 10/31/03 [redacted]w[redacted]d 7 lb (3.175 kg) F SVD None  2 Term 11/16/05 [redacted]w[redacted]d 6 lb 15 oz (3.147 kg) M SVD EPI  3 Term 09/28/07 [redacted]w[redacted]d 7 lb 7 oz (3.374 kg) F SVD   4 SAB 10/2009   5 Current    Past Medical History: Past Medical History  Diagnosis Date  . Anemia   . Asthma     Past Surgical History: Past Surgical History  Procedure Date  . No past surgeries     Family History: History reviewed. No pertinent family history.  Social History: History  Substance Use Topics  . Smoking status: Current Everyday Smoker -- 0.2 packs/day    Types: Cigarettes  . Smokeless tobacco: Not on file  . Alcohol Use: No    Allergies: No Known Allergies  Prescriptions prior to admission  Medication Sig Dispense Refill  . ferrous gluconate (FERGON) 325 MG tablet Take 325 mg by mouth daily with breakfast.      . prenatal vitamin w/FE, FA (PRENATAL 1 + 1) 27-1 MG TABS Take 1 tablet by mouth daily.      Marland Kitchen zolpidem (AMBIEN) 10 MG tablet Take 1 tablet (10 mg total) by mouth at bedtime as needed for sleep.  30 tablet  0    Review of Systems -  Breast ROS: negative for breast lumps Respiratory ROS: no cough, shortness of breath, or wheezing Cardiovascular ROS: no chest pain or dyspnea on exertion Gastrointestinal ROS: Contractions, change in bowel habits, or black or bloody stools Genito-Urinary ROS: no dysuria, trouble voiding, or hematuria Neurological ROS: no TIA or stroke symptoms  Physical Exam   Blood pressure 124/76, pulse 88, temperature 98 F (36.7 C), temperature source Oral, resp. rate 20, height 5' 3.5" (1.613 m), weight 164 lb (74.39 kg), last menstrual period 08/07/2010.  General: General appearance - alert, well appearing, and  in no distress and oriented to person, place, and time Mental status - alert, oriented to person, place, and time, normal mood, behavior, speech, dress, motor activity, and thought processes, affect appropriate to mood Chest - clear to auscultation, no wheezes, rales or rhonchi, symmetric air entry Heart - normal rate, regular rhythm, normal S1, S2, no murmurs, rubs, clicks or gallops Abdomen - Gravid, non tender Breasts - breasts appear normal, no suspicious masses, no skin or nipple changes or axillary nodes Neurological - alert, oriented, normal speech, no focal findings or movement disorder noted Extremities - peripheral pulses normal, no pedal edema, no clubbing or cyanosis Focused Gynecological Exam: 4/80/vtx/-2 per RN exam  Labs: A pos, antibody neg, HIV NR, Hep B neg, Rubella Imm, GBS neg, 1 hour 77, RPR NR   Imaging Studies:  04/27/2011: vtx/vtx on bedside US  Assessment: Active Labor Twins Vtx/Vtx presentation  Plan: Admit to LD with routine orders Epidural Expectant Management GBS neg  Kristle Wesch E. 04/27/2011,12:48 AM

## 2011-04-27 NOTE — Progress Notes (Signed)
Amy Mcdonald is a 26 y.o. [id number] at [redacted]w[redacted]d   Subjective: Comfortable. Sleeping with epidural in place  Objective: BP 100/48  Pulse 104  Temp(Src) 98 F (36.7 C) (Oral)  Resp 18  Ht 5' 3.5" (1.613 m)  Wt 164 lb (74.39 kg)  BMI 28.60 kg/m2  LMP 08/07/2010      FHT:  FHR: A 145; B: 135 bpm, variability: moderate,  accelerations:  Present,  decelerations:  Absent UC:   regular, every 1-3 minutes SVE:  5-6/vtx/-1/BBOW AROM: copious amount of clear fluid   Labs: Lab Results  Component Value Date   WBC 10.5 04/27/2011   HGB 10.1* 04/27/2011   HCT 30.7* 04/27/2011   MCV 87.2 04/27/2011   PLT 249 04/27/2011    Assessment / Plan: Protracted Active Labor  Labor: Augmented by AROM Preeclampsia:  n/a Fetal Wellbeing:  Category I x 2 Pain Control:  Epidural I/D:  n/a Anticipated MOD:  NSVD  Amy Mcdonald E. 04/27/2011, 5:48 AM

## 2011-04-27 NOTE — Anesthesia Preprocedure Evaluation (Signed)
Anesthesia Evaluation  Patient identified by MRN, date of birth, ID band Patient awake    Reviewed: Allergy & Precautions, H&P , Patient's Chart, lab work & pertinent test results  Airway Mallampati: III TM Distance: >3 FB Neck ROM: full    Dental No notable dental hx. (+) Teeth Intact   Pulmonary asthma ,  breath sounds clear to auscultation  Pulmonary exam normal       Cardiovascular negative cardio ROS  Rhythm:regular Rate:Normal     Neuro/Psych negative neurological ROS  negative psych ROS   GI/Hepatic negative GI ROS, Neg liver ROS,   Endo/Other  negative endocrine ROS  Renal/GU negative Renal ROS  negative genitourinary   Musculoskeletal   Abdominal   Peds  Hematology  (+) anemia ,   Anesthesia Other Findings   Reproductive/Obstetrics (+) Pregnancy                           Anesthesia Physical Anesthesia Plan  ASA: II  Anesthesia Plan: Epidural   Post-op Pain Management:    Induction:   Airway Management Planned:   Additional Equipment:   Intra-op Plan:   Post-operative Plan:   Informed Consent: I have reviewed the patients History and Physical, chart, labs and discussed the procedure including the risks, benefits and alternatives for the proposed anesthesia with the patient or authorized representative who has indicated his/her understanding and acceptance.     Plan Discussed with: Anesthesiologist and Surgeon  Anesthesia Plan Comments:         Anesthesia Quick Evaluation

## 2011-04-27 NOTE — Progress Notes (Signed)
Amy Mcdonald is a 26 y.o. [phone] at [redacted]w[redacted]d di/di Twins  Subjective: C/o cramping in upper abd  Objective: BP 105/59  Pulse 91  Temp(Src) 98 F (36.7 C) (Oral)  Resp 18  Ht 5' 3.5" (1.613 m)  Wt 164 lb (74.39 kg)  BMI 28.60 kg/m2  LMP 08/07/2010      FHT:  FHR: A: 125, B: 130 bpm, variability: moderate,  accelerations:  Present,  decelerations:  Absent UC:   regular, every 1-3 minutes SVE:  5-6/90/vtx/-1 Labs: Lab Results  Component Value Date   WBC 10.5 04/27/2011   HGB 10.1* 04/27/2011   HCT 30.7* 04/27/2011   MCV 87.2 04/27/2011   PLT 249 04/27/2011    Assessment / Plan: Spontaneous labor, progressing normally  Labor: Progressing normally Preeclampsia:  n/a Fetal Wellbeing:  Category I x 2 Pain Control:  Epidural I/D:  n/a Anticipated MOD:  NSVD  Will bolus epidural. Plan AROM when patient comfortable  Montgomery Rothlisberger E. 04/27/2011, 3:15 AM

## 2011-04-27 NOTE — Progress Notes (Signed)
UR chart review completed.  

## 2011-04-27 NOTE — Anesthesia Procedure Notes (Signed)
Epidural Patient location during procedure: OB Start time: 04/27/2011 1:34 AM  Staffing Anesthesiologist: Kerrianne Jeng A. Performed by: anesthesiologist   Preanesthetic Checklist Completed: patient identified, site marked, surgical consent, pre-op evaluation, timeout performed, IV checked, risks and benefits discussed and monitors and equipment checked  Epidural Patient position: sitting Prep: site prepped and draped and DuraPrep Patient monitoring: continuous pulse ox and blood pressure Approach: midline Injection technique: LOR air  Needle:  Needle type: Tuohy  Needle gauge: 17 G Needle length: 9 cm Needle insertion depth: 4 cm Catheter type: closed end flexible Catheter size: 19 Gauge Catheter at skin depth: 9 cm Test dose: negative  Assessment Events: blood not aspirated, injection not painful, no injection resistance, negative IV test and no paresthesia  Additional Notes Patient identified. Risks and benefits discussed including failed block, incomplete  Pain control, post dural puncture headache, nerve damage, paralysis, blood pressure Changes, nausea, vomiting, reactions to medications-both toxic and allergic and post Partum back pain. All questions were answered. Patient expressed understanding and wished to proceed. Sterile technique was used throughout procedure. Epidural site was Dressed with sterile barrier dressing. No paresthesias, signs of intravascular injection Or signs of intrathecal spread were encountered.  Patient was more comfortable after the epidural was dosed. Please see RN's note for documentation of vital signs and FHR which are stable.

## 2011-04-28 ENCOUNTER — Inpatient Hospital Stay (HOSPITAL_COMMUNITY): Payer: Self-pay | Admitting: Anesthesiology

## 2011-04-28 ENCOUNTER — Encounter (HOSPITAL_COMMUNITY): Payer: Self-pay | Admitting: Anesthesiology

## 2011-04-28 ENCOUNTER — Encounter (HOSPITAL_COMMUNITY)
Admission: AD | Disposition: A | Discharge status: Home / Self Care | Payer: Self-pay | Source: Home / Self Care | Attending: Obstetrics and Gynecology

## 2011-04-28 DIAGNOSIS — Z302 Encounter for sterilization: Secondary | ICD-10-CM

## 2011-04-28 HISTORY — PX: TUBAL LIGATION: SHX77

## 2011-04-28 SURGERY — LIGATION, FALLOPIAN TUBE, POSTPARTUM
Anesthesia: Spinal | Site: Abdomen | Laterality: Bilateral | Wound class: Clean

## 2011-04-28 MED ORDER — FENTANYL CITRATE 0.05 MG/ML IJ SOLN
INTRAMUSCULAR | Status: AC
  Filled 2011-04-28: qty 5

## 2011-04-28 MED ORDER — KETOROLAC TROMETHAMINE 30 MG/ML IJ SOLN
INTRAMUSCULAR | Status: AC
  Filled 2011-04-28: qty 1

## 2011-04-28 MED ORDER — LACTATED RINGERS IV SOLN
INTRAVENOUS | Status: DC | PRN
  Administered 2011-04-28: 11:00:00 via INTRAVENOUS

## 2011-04-28 MED ORDER — MEPERIDINE HCL 25 MG/ML IJ SOLN: 6.2500 mg | INTRAMUSCULAR | Status: DC | PRN

## 2011-04-28 MED ORDER — FENTANYL CITRATE 0.05 MG/ML IJ SOLN
INTRAMUSCULAR | Status: DC | PRN
  Administered 2011-04-28 (×4): 50 ug via INTRAVENOUS

## 2011-04-28 MED ORDER — MIDAZOLAM HCL 2 MG/2ML IJ SOLN
INTRAMUSCULAR | Status: AC
  Filled 2011-04-28: qty 2

## 2011-04-28 MED ORDER — KETOROLAC TROMETHAMINE 30 MG/ML IJ SOLN
INTRAMUSCULAR | Status: DC | PRN
  Administered 2011-04-28: 30 mg via INTRAVENOUS

## 2011-04-28 MED ORDER — ZOLPIDEM TARTRATE 5 MG PO TABS: 5.0000 mg | ORAL_TABLET | Freq: Every evening | ORAL | Status: DC | PRN

## 2011-04-28 MED ORDER — ONDANSETRON HCL 4 MG/2ML IJ SOLN
INTRAMUSCULAR | Status: DC | PRN
  Administered 2011-04-28: 4 mg via INTRAVENOUS

## 2011-04-28 MED ORDER — LANOLIN HYDROUS EX OINT: TOPICAL_OINTMENT | CUTANEOUS | Status: DC | PRN

## 2011-04-28 MED ORDER — KETOROLAC TROMETHAMINE 30 MG/ML IJ SOLN: 15.0000 mg | Freq: Once | INTRAMUSCULAR | Status: DC | PRN

## 2011-04-28 MED ORDER — ACETAMINOPHEN 325 MG PO TABS: 325.0000 mg | ORAL_TABLET | ORAL | Status: DC | PRN

## 2011-04-28 MED ORDER — ONDANSETRON HCL 4 MG/2ML IJ SOLN
INTRAMUSCULAR | Status: AC
  Filled 2011-04-28: qty 2

## 2011-04-28 MED ORDER — PROMETHAZINE HCL 25 MG/ML IJ SOLN: 6.2500 mg | INTRAMUSCULAR | Status: DC | PRN

## 2011-04-28 MED ORDER — SODIUM BICARBONATE 8.4 % IV SOLN
INTRAVENOUS | Status: DC | PRN
  Administered 2011-04-28: 5 mL via EPIDURAL

## 2011-04-28 MED ORDER — PROPOFOL 10 MG/ML IV EMUL
INTRAVENOUS | Status: AC
  Filled 2011-04-28: qty 20

## 2011-04-28 MED ORDER — FENTANYL CITRATE 0.05 MG/ML IJ SOLN: 25.0000 ug | INTRAMUSCULAR | Status: DC | PRN

## 2011-04-28 MED ORDER — MIDAZOLAM HCL 2 MG/2ML IJ SOLN: 0.5000 mg | Freq: Once | INTRAMUSCULAR | Status: DC | PRN

## 2011-04-28 MED ORDER — MIDAZOLAM HCL 5 MG/5ML IJ SOLN
INTRAMUSCULAR | Status: DC | PRN
  Administered 2011-04-28 (×2): 1 mg via INTRAVENOUS

## 2011-04-28 MED ORDER — LIDOCAINE-EPINEPHRINE (PF) 2 %-1:200000 IJ SOLN
INTRAMUSCULAR | Status: AC
  Filled 2011-04-28: qty 20

## 2011-04-28 MED ORDER — BUPIVACAINE HCL (PF) 0.25 % IJ SOLN
INTRAMUSCULAR | Status: DC | PRN
  Administered 2011-04-28: 5 mL

## 2011-04-28 MED ORDER — SODIUM BICARBONATE 8.4 % IV SOLN
INTRAVENOUS | Status: AC
  Filled 2011-04-28: qty 50

## 2011-04-28 MED ORDER — LIDOCAINE HCL (CARDIAC) 20 MG/ML IV SOLN
INTRAVENOUS | Status: AC
  Filled 2011-04-28: qty 5

## 2011-04-28 SURGICAL SUPPLY — 20 items
BLADE SURG 11 STRL SS (BLADE) ×2 IMPLANT
CHLORAPREP W/TINT 26ML (MISCELLANEOUS) ×2 IMPLANT
CLIP FILSHIE TUBAL LIGA STRL (Clip) ×2 IMPLANT
CLOTH BEACON ORANGE TIMEOUT ST (SAFETY) ×2 IMPLANT
DRSG COVADERM PLUS 2X2 (GAUZE/BANDAGES/DRESSINGS) ×2 IMPLANT
GLOVE BIO SURGEON STRL SZ 6.5 (GLOVE) ×2 IMPLANT
GLOVE BIOGEL PI IND STRL 7.0 (GLOVE) ×2 IMPLANT
GLOVE BIOGEL PI INDICATOR 7.0 (GLOVE) ×2
GOWN PREVENTION PLUS LG XLONG (DISPOSABLE) ×4 IMPLANT
NEEDLE HYPO 25X1 1.5 SAFETY (NEEDLE) IMPLANT
NS IRRIG 1000ML POUR BTL (IV SOLUTION) ×2 IMPLANT
PACK ABDOMINAL MINOR (CUSTOM PROCEDURE TRAY) ×2 IMPLANT
SPONGE LAP 4X18 X RAY DECT (DISPOSABLE) IMPLANT
SUT VIC AB 0 CT1 27 (SUTURE) ×1
SUT VIC AB 0 CT1 27XBRD ANBCTR (SUTURE) ×1 IMPLANT
SUT VICRYL 4-0 PS2 18IN ABS (SUTURE) ×2 IMPLANT
SYR CONTROL 10ML LL (SYRINGE) ×2 IMPLANT
TOWEL OR 17X24 6PK STRL BLUE (TOWEL DISPOSABLE) ×4 IMPLANT
TRAY FOLEY BAG SILVER LF 14FR (CATHETERS) ×2 IMPLANT
WATER STERILE IRR 1000ML POUR (IV SOLUTION) ×2 IMPLANT

## 2011-04-28 NOTE — Anesthesia Postprocedure Evaluation (Signed)
Anesthesia Post Note  Patient: Amy Mcdonald  Procedure(s) Performed: Procedure(s) (LRB): POST PARTUM TUBAL LIGATION (Bilateral)  Anesthesia type: Epidural  Patient location: PACU  Post pain: Pain level controlled  Post assessment: Post-op Vital signs reviewed  Last Vitals:  Filed Vitals:   04/28/11 1122  BP:   Pulse:   Temp: 37.2 C  Resp:     Post vital signs: Reviewed  Level of consciousness: awake  Complications: No apparent anesthesia complications

## 2011-04-28 NOTE — Anesthesia Postprocedure Evaluation (Signed)
  Anesthesia Post-op Note  Patient: Amy Mcdonald  Procedure(s) Performed  * Lumbar Epidural for L&D*  Patient Location: Mother/Baby  Anesthesia Type: Epidural  Level of Consciousness: awake, alert  and oriented  Airway and Oxygen Therapy: Patient Spontanous Breathing  Post-op Pain: none  Post-op Assessment: Post-op Vital signs reviewed, Patient's Cardiovascular Status Stable, Respiratory Function Stable, Patent Airway, No signs of Nausea or vomiting, Adequate PO intake, Pain level controlled, No headache, No backache, No residual numbness and No residual motor weakness  Post-op Vital Signs: Reviewed and stable  Complications: No apparent anesthesia complications

## 2011-04-28 NOTE — Anesthesia Preprocedure Evaluation (Addendum)
Anesthesia Evaluation  Patient identified by MRN, date of birth, ID band Patient awake    Reviewed: Allergy & Precautions, H&P , Patient's Chart, lab work & pertinent test results  Airway Mallampati: II      Dental No notable dental hx.    Pulmonary neg pulmonary ROS, asthma ,  breath sounds clear to auscultation  Pulmonary exam normal       Cardiovascular Exercise Tolerance: Good negative cardio ROS  Rhythm:regular Rate:Normal     Neuro/Psych negative neurological ROS  negative psych ROS   GI/Hepatic negative GI ROS, Neg liver ROS,   Endo/Other  negative endocrine ROS  Renal/GU negative Renal ROS  negative genitourinary   Musculoskeletal   Abdominal Normal abdominal exam  (+)   Peds  Hematology negative hematology ROS (+)   Anesthesia Other Findings   Reproductive/Obstetrics negative OB ROS                           Anesthesia Physical Anesthesia Plan  ASA: II  Anesthesia Plan: Epidural   Post-op Pain Management:    Induction:   Airway Management Planned:   Additional Equipment:   Intra-op Plan:   Post-operative Plan:   Informed Consent: I have reviewed the patients History and Physical, chart, labs and discussed the procedure including the risks, benefits and alternatives for the proposed anesthesia with the patient or authorized representative who has indicated his/her understanding and acceptance.     Plan Discussed with: Anesthesiologist, CRNA and Surgeon  Anesthesia Plan Comments:        Anesthesia Quick Evaluation

## 2011-04-28 NOTE — Progress Notes (Signed)
Post Partum Day 1 Subjective: up ad lib, voiding, tolerating PO, + flatus and Complains of back pain and occasional LE numbness from epidural  Objective: Blood pressure 116/72, pulse 84, temperature 98.4 F (36.9 C), temperature source Oral, resp. rate 18, height 5' 3.5" (1.613 m), weight 74.39 kg (164 lb), last menstrual period 08/07/2010, unknown if currently breastfeeding.  Physical Exam:  General: alert, cooperative, appears stated age and no distress Lochia: appropriate Uterine Fundus: firm DVT Evaluation: No evidence of DVT seen on physical exam. No significant calf/ankle edema.   Basename 04/27/11 0045  HGB 10.1*  HCT 30.7*    Assessment/Plan: Plan for discharge tomorrow and Contraception BTL today. NPO since midnight.   LOS: 1 day   MERRELL, DAVID 04/28/2011, 7:32 AM   Patient seen and examined by me. Agree with above resident note. Patient scheduled for BTL today.

## 2011-04-28 NOTE — Transfer of Care (Signed)
Immediate Anesthesia Transfer of Care Note  Patient: Amy Mcdonald  Procedure(s) Performed: Procedure(s) (LRB): POST PARTUM TUBAL LIGATION (Bilateral)  Patient Location: PACU  Anesthesia Type: Epidural  Level of Consciousness: awake, alert  and oriented  Airway & Oxygen Therapy: Patient Spontanous Breathing  Post-op Assessment: Report given to PACU RN and Post -op Vital signs reviewed and stable  Post vital signs: stable  Complications: No apparent anesthesia complications

## 2011-04-28 NOTE — Op Note (Signed)
Amy Mcdonald 04/27/2011 - 04/28/2011  PREOPERATIVE DIAGNOSIS:  Multiparity, undesired fertility  POSTOPERATIVE DIAGNOSIS:  Multiparity, undesired fertility  PROCEDURE:  Postpartum Bilateral Tubal Sterilization using Filshie Clips   ANESTHESIA:  Epidural  COMPLICATIONS:  None immediate.  ESTIMATED BLOOD LOSS:  Less than 20 ml.  FLUIDS: 1000 ml LR.  URINE OUTPUT:  30 ml of clear urine.  INDICATIONS: 26 y.o. [id number]  with undesired fertility,status post vaginal delivery, desires permanent sterilization. Risks and benefits of procedure discussed with patient including permanence of method, bleeding, infection, injury to surrounding organs and need for additional procedures. Risk failure of 0.5-1% with increased risk of ectopic gestation if pregnancy occurs was also discussed with patient.   FINDINGS:  Normal uterus, tubes, and ovaries.  TECHNIQUE:  The patient was taken to the operating room where her epidural anesthesia was dosed up to surgical level and found to be adequate.  She was then placed in the dorsal supine position and prepped and draped in sterile fashion.  After an adequate timeout was performed, attention was turned to the patient's abdomen where a small transverse skin incision was made under the umbilical fold. The incision was taken down to the layer of fascia using the scalpel, and fascia was incised, and extended bilaterally using Mayo scissors. The peritoneum was entered in a sharp fashion. Attention was then turned to the patient's uterus, and left fallopian tube was identified and followed out to the fimbriated end.  A Filshie clip was placed on the left fallopian tube about 2 cm from the cornual attachment, with care given to incorporate the underlying mesosalpinx.  A similar process was carried out on the right side allowing for bilateral tubal sterilization.  Good hemostasis was noted overall. The instruments were then removed from the patient's abdomen and the fascial  incision was repaired with 0 Vicryl, and the skin was closed with a 4-0 Vicryl subcuticular stitch. The patient tolerated the procedure well.  Sponge, lap, and needle counts were correct times two.  The patient was then taken to the recovery room awake and in stable condition.  Amy Mcdonald 04/28/11

## 2011-04-29 ENCOUNTER — Encounter (HOSPITAL_COMMUNITY): Payer: Self-pay | Admitting: Obstetrics & Gynecology

## 2011-04-29 DIAGNOSIS — O30009 Twin pregnancy, unspecified number of placenta and unspecified number of amniotic sacs, unspecified trimester: Secondary | ICD-10-CM

## 2011-04-29 MED ORDER — TETANUS-DIPHTH-ACELL PERTUSSIS 5-2.5-18.5 LF-MCG/0.5 IM SUSP
0.5000 mL | Freq: Once | INTRAMUSCULAR | Status: AC
  Administered 2011-04-29: 0.5 mL via INTRAMUSCULAR

## 2011-04-29 MED ORDER — IBUPROFEN 600 MG PO TABS: 600.0000 mg | ORAL_TABLET | Freq: Four times a day (QID) | ORAL | Status: AC

## 2011-04-29 MED ORDER — OXYCODONE-ACETAMINOPHEN 5-325 MG PO TABS: 1.0000 | ORAL_TABLET | ORAL | Status: AC | PRN

## 2011-04-29 NOTE — Progress Notes (Signed)
Clinical Social Work Department  PSYCHOSOCIAL ASSESSMENT - MATERNAL/CHILD  04/29/2011  Patient: Amy Mcdonald,Amy Mcdonald Account Number: [id number] Admit Date: 04/27/2011  Childs Name:  Jordan Swint   Matthew Imbert   Clinical Social Worker: Nehal Witting, LCSWA Date/Time: 04/29/2011 11:30 AM  Date Referred: 04/29/2011  Referral source   CN    Referred reason   LPNC   Other referral source:  I: FAMILY / HOME ENVIRONMENT  Child's legal guardian: PARENT  Guardian - Name  Guardian - Age  Guardian - Address   Amy Mcdonald  26  [address] [place], [place] [address]   Joseph Campbell     Other household support members/support persons  Other support:  Family  Tiara Campbell 7 yrs. old (daughter)  Bryana Campbell 3 yrs. old (daughter)   II PSYCHOSOCIAL DATA  Information Source: Patient Interview  Financial and Community Resources  Employment:  Financial resources: Self Pay  If Medicaid - County:  Other   WIC   Food Stamps   School / Grade:  Maternity Care Coordinator / Child Services Coordination / Early Interventions: Cultural issues impacting care:  III STRENGTHS  Strengths   Adequate Resources   Supportive family/friends   Home prepared for Child (including basic supplies)   Strength comment:  IV RISK FACTORS AND CURRENT PROBLEMS  Current Problem:  Risk Factor & Current Problem  Patient Issue  Family Issue  Risk Factor / Current Problem Comment    N  N  LPNC   V SOCIAL WORK ASSESSMENT  Pt states she did not learn of pregnancy until she was 28 weeks. Once pregnancy was confirmed, she started PNC and attended all appointments regularly. She denies any illegal substance use and verbalized understanding of hospital drug testing policy. UDS is negative, meconium results are pending. She reports having all the necessary supplies for the infant. Pt's daughter are living with their grandmother. Pt denies CPS involvement and this Sw confirmed with Shelia Stokes ([place] CPS  worker). Sw will follow up with drug screen results and make a referral if needed.   VI SOCIAL WORK PLAN  Social Work Plan   No Further Intervention Required / No Barriers to Discharge   Type of pt/family education:  If child protective services report - county:  If child protective services report - date:  Information/referral to community resources comment:  Other social work plan:   

## 2011-04-29 NOTE — Discharge Instructions (Signed)
Postpartum Care After Vaginal Delivery After you deliver your baby, you will stay in the hospital for 24 to 72 hours, unless there were problems with the labor or delivery, or you have medical problems. While you are in the hospital, you will receive help and instructions on how to care for yourself and your baby. Your doctor will order pain medicine, in case you need it. You will have a small amount of bleeding from your vagina and should change your sanitary pad frequently. Wash your hands thoroughly with soap and water for at least 20 seconds after changing pads and using the toilet. Let the nurses know if you begin to pass blood clots or your bleeding increases. Do not flush blood clots down the toilet before having the nurse look at them, to make sure there is no placental tissue with them. If you had an intravenous (IV), it will be removed within 24 hours, if there are no problems. The first time you get out of bed or take a shower, call the nurse to help you because you may get weak, lightheaded, or even faint. If you are breastfeeding, you may feel painful contractions of your uterus for a couple of weeks. This is normal. The contractions help your uterus get back to normal size. If you are not breastfeeding, wear a supportive bra and handle your breasts as little as possible until your milk has dried up. Hormones should not be given to dry up the breasts, because they can cause blood clots. You will be given your normal diet, unless you have diabetes or other medical problems.  The nurses may put an ice pack on your episiotomy (surgically enlarged opening), if you have one, to reduce the pain and swelling. On rare occasions, you may not be able to urinate and the nurse will need to empty your bladder with a catheter. If you had a postpartum tubal ligation ("tying tubes," female sterilization), it should not make your stay in the hospital longer. You may have your baby in your room with you as much as  you like, unless you or the baby has a problem. Use the bassinet (basket) for the baby when going to and from the nursery. Do not carry the baby. Do not leave the postpartum area. If the mother is Rh negative (lacks a protein on the red blood cells) and the baby is Rh positive, the mother should get a Rho-gam shot to prevent Rh problems with future pregnancies. You may be given written instructions for you and your baby, and necessary medicines, when you are discharged from the hospital. Be sure you understand and follow the instructions as advised. HOME CARE INSTRUCTIONS   Follow instructions and take the medicines given to you.   Only take over-the-counter or prescription medicines for pain, discomfort, or fever as directed by your caregiver.   Do not take aspirin, because it can cause bleeding.   Increase your activities a little bit every day to build up your strength and endurance.   Do not drink alcohol, especially if you are breastfeeding or taking pain medicine.   Take your temperature twice a day and record it.   You may have a small amount of bleeding or spotting for 2 to 4 weeks. This is normal.   Do not use tampons or douche. Use sanitary pads.   Try to have someone stay and help you for a few days when you go home.   Try to rest or take a nap when   the baby is sleeping.   If you are breastfeeding, wear a good support bra. If you are not breastfeeding, wear a supportive bra and do not stimulate your nipples.   Eat a healthy, nutritious diet and continue to take your prenatal vitamins.   Do not drive, do any heavy activities, or travel until your caregiver tells you it is okay.   Do not have intercourse until your caregiver gives you permission to do so.   Ask your caregiver when you can begin to exercise and what type of exercises to do.   Call your caregiver if you think you are having a problem from your delivery.   Call your pediatrician if you are having a problem  with the baby.   Schedule your postpartum visit and keep it.  SEEK MEDICAL CARE IF:   You have a temperature of 100 F (37.8 C) or higher.   You have increased vaginal bleeding or are passing clots. Save any clots to show your caregiver.   You have bloody urine or pain when you urinate.   You have a bad smelling vaginal discharge.   You have increasing pain or swelling on your episiotomy.   You develop a severe headache.   You feel depressed.   The episiotomy is separating.   You become dizzy or lightheaded.   You develop a rash.   You have a reaction or problems with your medicine.   You have pain, redness, or swelling at the intravenous site.  SEEK IMMEDIATE MEDICAL CARE IF:   You have chest pain.   You develop shortness of breath.   You pass out.   You develop pain, with or without swelling or redness in your leg.   You develop heavy vaginal bleeding, with or without blood clots.   You develop stomach pain.   You develop a bad smelling vaginal discharge.  MAKE SURE YOU:   Understand these instructions.   Will watch your condition.   Will get help right away if you are not doing well or get worse.  Document Released: 11/02/2006 Document Revised: 12/25/2010 Document Reviewed: 11/14/2008 ExitCare Patient Information 2012 [employer].   Postpartum Depression and Baby Blues The postpartum period begins right after the birth of a baby. During this time, there is often a great amount of joy and excitement. It is also a time of considerable changes in the life of the parent(s). Regardless of how many times a mother gives birth, each child brings new challenges and dynamics to the family. It is not unusual to have feelings of excitement accompanied by confusing shifts in moods, emotions, and thoughts. All mothers are at risk of developing postpartum depression or the "baby blues." These mood changes can occur right after giving birth, or they may occur many  months after giving birth. The baby blues or postpartum depression can be mild or severe. Additionally, postpartum depression can resolve rather quickly, or it can be a long-term condition. CAUSES Elevated hormones and their rapid decline are thought to be a main cause of postpartum depression and the baby blues. There are a number of hormones that radically change during and after pregnancy. Estrogen and progesterone usually decrease immediately after delivering your baby. The level of thyroid hormone and various cortisol steroids also rapidly drop. Other factors that play a major role in these changes include major life events and genetics.  RISK FACTORS If you have any of the following risks for the baby blues or postpartum depression, know what   symptoms to watch out for during the postpartum period. Risk factors that may increase the likelihood of getting the baby blues or postpartum depression include:  Havinga personal or family history of depression.   Having depression while being pregnant.   Having premenstrual or oral contraceptive-associated mood issues.   Having exceptional life stress.   Having marital conflict.   Lacking a social support network.   Having a baby with special needs.   Having health problems such as diabetes.  SYMPTOMS Baby blues symptoms include:  Brief fluctuations in mood, such as going from extreme happiness to sadness.   Decreased concentration.   Difficulty sleeping.   Crying spells, tearfulness.   Irritability.   Anxiety.  Postpartum depression symptoms typically begin within the first month after giving birth. These symptoms include:  Difficulty sleeping or excessive sleepiness.   Marked weight loss.   Agitation.   Feelings of worthlessness.   Lack of interest in activity or food.  Postpartum psychosis is a very concerning condition and can be dangerous. Fortunately, it is rare. Displaying any of the following symptoms is cause for  immediate medical attention. Postpartum psychosis symptoms include:  Hallucinations and delusions.   Bizarre or disorganized behavior.   Confusion or disorientation.  DIAGNOSIS  A diagnosis is made by an evaluation of your symptoms. There are no medical or lab tests that lead to a diagnosis, but there are various questionnaires that a caregiver may use to identify those with the baby blues, postpartum depression, or psychosis. Often times, a screening tool called the Edinburgh Postnatal Depression Scale is used to diagnose depression in the postpartum period.  TREATMENT The baby blues usually goes away on its own in 1 to 2 weeks. Social support is often all that is needed. You should be encouraged to get adequate sleep and rest. Occasionally, you may be given medicines to help you sleep.  Postpartum depression requires treatment as it can last several months or longer if it is not treated. Treatment may include individual or group therapy, medicine, or both to address any social, physiological, and psychological factors that may play a role in the depression. Regular exercise, a healthy diet, rest, and social support may also be strongly recommended.  Postpartum psychosis is more serious and needs treatment right away. Hospitalization is often needed. HOME CARE INSTRUCTIONS  Get as much rest as you can. Nap when the baby sleeps.   Exercise regularly. Some women find yoga and walking to be beneficial.   Eat a balanced and nourishing diet.   Do little things that you enjoy. Have a cup of tea, take a bubble bath, read your favorite magazine, or listen to your favorite music.   Avoid alcohol.   Ask for help with household chores, cooking, grocery shopping, or running errands as needed. Do not try to do everything.   Talk to people close to you about how you are feeling. Get support from your partner, family members, friends, or other new moms.   Try to stay positive in how you think. Think  about the things you are grateful for.   Do not spend a lot of time alone.   Only take medicine as directed by your caregiver.   Keep all your postpartum appointments.   Let your caregiver know if you have any concerns.  SEEK MEDICAL CARE IF: You are having a reaction or problems with your medicine. SEEK IMMEDIATE MEDICAL CARE IF:  You have suicidal feelings.   You feel you may harm   the baby or someone else.  Document Released: 10/10/2003 Document Revised: 12/25/2010 Document Reviewed: 11/11/2010 ExitCare Patient Information 2012 [employer]. 

## 2011-04-29 NOTE — Discharge Summary (Signed)
Obstetric Discharge Summary Reason for Admission: onset of labor Prenatal Procedures: ultrasound Intrapartum Procedures: spontaneous vaginal delivery Postpartum Procedures: P.P. tubal ligation Complications-Operative and Postpartum: none Hemoglobin  Date Value Range Status  04/27/2011 10.1* 12.0-15.0 (g/dL) Final     HCT  Date Value Range Status  04/27/2011 30.7* 36.0-46.0 (%) Final    Physical Exam:  General: alert, cooperative and no distress Lochia: appropriate Uterine Fundus: firm Incision: N/A DVT Evaluation: Negative Homan's sign. No cords or calf tenderness. No significant calf/ankle edema.  Discharge Diagnoses: Term Pregnancy-delivered  Discharge Information: Date: 04/29/2011 Activity: pelvic rest Diet: routine Medications: Ibuprofen and Percocet Condition: stable Instructions: refer to practice specific booklet Discharge to: home Follow-up Information    Please follow up. ([facility] appointment [facility] in 5-6 weeks.  )          Newborn Data:   Agripina, Guyette [[id number]]  Live born female  Birth Weight: 5 lb 6.6 oz (2455 g) APGAR: 9, 9   Betheny, Suchecki [place] [[id number]]  Live born female  Birth Weight: 5 lb 2.9 oz (2350 g) APGAR: 7, 8  Home with mother.  LEFTWICH-KIRBY, Jawanda Passey 04/29/2011, 9:06 AM

## 2011-04-29 NOTE — Progress Notes (Signed)
Post Partum Day 2 Subjective: no complaints, up ad lib, voiding, tolerating PO and + flatus Reports mild pain from BTL incision.   Objective: Blood pressure 116/68, pulse 85, temperature 98.3 F (36.8 C), temperature source Oral, resp. rate 18, height 5' 3.5" (1.613 m), weight 74.39 kg (164 lb), last menstrual period 08/07/2010, SpO2 100.00%, unknown if currently breastfeeding.  Physical Exam:  General: alert, cooperative, appears stated age and no distress Lochia: appropriate Uterine Fundus: firm BTL incision: healing well, no significant erythema DVT Evaluation: No evidence of DVT seen on physical exam. Negative Homan's sign. No cords or calf tenderness. No significant calf/ankle edema.   Basename 04/27/11 0045  HGB 10.1*  HCT 30.7*    Assessment/Plan: Discharge home Plans to have twin boys circumcised outpatient - does not need list  Decided to bottle feed exclusively Pediatrician -  Dr. Jeanice Lim Contraception: BTL     LOS: 2 days   Amy Mcdonald 04/29/2011, 7:44 AM    I have seen and examined this patient and I agree with the above. Amy Mcdonald 8:00 AM 04/29/2011

## 2011-05-04 DIAGNOSIS — O0930 Supervision of pregnancy with insufficient antenatal care, unspecified trimester: Secondary | ICD-10-CM

## 2011-05-13 NOTE — H&P (Signed)
Attestation of Attending Supervision of Advanced Practitioner: Evaluation and management procedures were performed by the PA/NP/CNM/OB Fellow under my supervision/collaboration. Chart reviewed and agree with management and plan.  Nabeel Gladson V 05/13/2011 8:11 PM    

## 2011-05-25 ENCOUNTER — Ambulatory Visit: Payer: Self-pay | Admitting: Advanced Practice Midwife

## 2011-05-26 LAB — FETAL NONSTRESS TEST

## 2013-04-02 ENCOUNTER — Encounter (HOSPITAL_BASED_OUTPATIENT_CLINIC_OR_DEPARTMENT_OTHER): Payer: Self-pay | Admitting: Emergency Medicine

## 2013-04-02 ENCOUNTER — Emergency Department (HOSPITAL_BASED_OUTPATIENT_CLINIC_OR_DEPARTMENT_OTHER)
Admission: EM | Admit: 2013-04-02 | Discharge: 2013-04-02 | Disposition: A | Discharge status: Home / Self Care | Payer: Self-pay | Attending: Emergency Medicine | Admitting: Emergency Medicine

## 2013-04-02 DIAGNOSIS — D649 Anemia, unspecified: Secondary | ICD-10-CM | POA: Insufficient documentation

## 2013-04-02 DIAGNOSIS — J45909 Unspecified asthma, uncomplicated: Secondary | ICD-10-CM | POA: Insufficient documentation

## 2013-04-02 DIAGNOSIS — Z79899 Other long term (current) drug therapy: Secondary | ICD-10-CM | POA: Insufficient documentation

## 2013-04-02 DIAGNOSIS — K0520 Aggressive periodontitis, unspecified: Secondary | ICD-10-CM | POA: Insufficient documentation

## 2013-04-02 DIAGNOSIS — F17200 Nicotine dependence, unspecified, uncomplicated: Secondary | ICD-10-CM | POA: Insufficient documentation

## 2013-04-02 MED ORDER — IBUPROFEN 600 MG PO TABS: 600.0000 mg | ORAL_TABLET | Freq: Four times a day (QID) | ORAL | Status: DC | PRN

## 2013-04-02 MED ORDER — OXYCODONE-ACETAMINOPHEN 5-325 MG PO TABS
1.0000 | ORAL_TABLET | Freq: Once | ORAL | Status: AC
  Administered 2013-04-02: 1 via ORAL
  Filled 2013-04-02: qty 1

## 2013-04-02 MED ORDER — HYDROCODONE-ACETAMINOPHEN 5-325 MG PO TABS: 2.0000 | ORAL_TABLET | ORAL | Status: DC | PRN

## 2013-04-02 MED ORDER — AMOXICILLIN 500 MG PO CAPS: 500.0000 mg | ORAL_CAPSULE | Freq: Three times a day (TID) | ORAL | Status: DC

## 2013-04-02 NOTE — ED Notes (Signed)
Left side dental and facial pain x 3 days

## 2013-04-02 NOTE — ED Provider Notes (Signed)
CSN: [id number]     Arrival date & time 04/02/13  1611 History   First MD Initiated Contact with Patient 04/02/13 1628     Chief Complaint  Patient presents with  . Dental Pain     (Consider location/radiation/quality/duration/timing/severity/associated sxs/prior Treatment) Patient is a 28 y.o. female presenting with tooth pain. The history is provided by the patient.  Dental Pain Location:  Upper Upper teeth location:  16/LU 3rd molar Quality:  Throbbing and constant Onset quality:  Gradual Duration:  3 days Timing:  Constant Progression:  Worsening Chronicity:  New Relieved by:  Nothing Worsened by:  Cold food/drink and pressure Ineffective treatments:  NSAIDs Associated symptoms: facial pain and facial swelling   Associated symptoms: no headaches  Fever: low grade.   Risk factors: lack of dental care and smoking   Risk factors: no diabetes     Past Medical History  Diagnosis Date  . Anemia   . Asthma    Past Surgical History  Procedure Laterality Date  . No past surgeries    . Tubal ligation  04/28/2011    Procedure: POST PARTUM TUBAL LIGATION;  Surgeon: Adam PhenixJames G Arnold, MD;  Location: [facility];  Service: Gynecology;  Laterality: Bilateral;   No family history on file. History  Substance Use Topics  . Smoking status: Current Every Day Smoker -- 0.25 packs/day    Types: Cigarettes  . Smokeless tobacco: Never Used  . Alcohol Use: No   OB History   Grav Para Term Preterm Abortions TAB SAB Ect Mult Living   5 4 4  0 1  1  1 5      Review of Systems  Constitutional: Negative for chills. Fever: low grade.  HENT: Positive for dental problem and facial swelling. Negative for ear pain, sinus pressure and sore throat.   Respiratory: Negative for cough and shortness of breath.   Cardiovascular: Negative for chest pain.  Gastrointestinal: Negative for nausea, vomiting and abdominal pain.  Genitourinary: Negative for urgency and frequency.  Musculoskeletal: Negative for  neck stiffness.  Skin: Negative for rash.  Neurological: Negative for headaches.  Psychiatric/Behavioral: The patient is not nervous/anxious.       Allergies  Review of patient's allergies indicates no known allergies.  Home Medications   Current Outpatient Rx  Name  Route  Sig  Dispense  Refill  . ferrous gluconate (FERGON) 325 MG tablet   Oral   Take 325 mg by mouth daily with breakfast.         . prenatal vitamin w/FE, FA (PRENATAL 1 + 1) 27-1 MG TABS   Oral   Take 1 tablet by mouth daily.          BP 145/102  Pulse 90  Temp(Src) 99.4 F (37.4 C) (Oral)  Resp 20  Ht 5' 4.5" (1.638 m)  Wt 165 lb (74.844 kg)  BMI 27.90 kg/m2  SpO2 100%  LMP 03/07/2013  Breastfeeding? No Physical Exam  Nursing note and vitals reviewed. Constitutional: She is oriented to person, place, and time. She appears well-developed and well-nourished.  HENT:  Head: Normocephalic.  Right Ear: Tympanic membrane normal.  Left Ear: Tympanic membrane normal.  Nose: Nose normal.  Mouth/Throat: Uvula is midline, oropharynx is clear and moist and mucous membranes are normal.    Left lower 3rd molar with swelling of the gum surrounding the partially erupted tooth. Tender on palpation.   Eyes: EOM are normal.  Neck: Neck supple.  Cardiovascular: Normal rate and regular rhythm.   Pulmonary/Chest:  Effort normal and breath sounds normal.  Musculoskeletal: Normal range of motion.  Lymphadenopathy:    She has cervical adenopathy (left).  Neurological: She is alert and oriented to person, place, and time. No cranial nerve deficit.  Skin: Skin is warm and dry.  Psychiatric: She has a normal mood and affect. Her behavior is normal.    ED Course  Procedures  MDM  28 y.o. female with severe pain of the left lower 3rd molar that is partially erupted. Will treat for pain and infection. Information given for dental clinic. Discussed with the patient and all questioned fully answered. She will return  if any problems arise.    Medication List    TAKE these medications       amoxicillin 500 MG capsule  Commonly known as:  AMOXIL  Take 1 capsule (500 mg total) by mouth 3 (three) times daily.     HYDROcodone-acetaminophen 5-325 MG per tablet  Commonly known as:  NORCO/VICODIN  Take 2 tablets by mouth every 4 (four) hours as needed.     ibuprofen 600 MG tablet  Commonly known as:  ADVIL,MOTRIN  Take 1 tablet (600 mg total) by mouth every 6 (six) hours as needed.      ASK your doctor about these medications       ferrous gluconate 325 MG tablet  Commonly known as:  FERGON  Take 325 mg by mouth daily with breakfast.     prenatal vitamin w/FE, FA 27-1 MG Tabs tablet  Take 1 tablet by mouth daily.           [facility] Orlene Och, [place] 04/02/13 1723

## 2013-04-02 NOTE — ED Provider Notes (Signed)
Medical screening examination/treatment/procedure(s) were performed by non-physician practitioner and as supervising physician I was immediately available for consultation/collaboration.   EKG Interpretation None        William Babetta Paterson, MD 04/02/13 2342 

## 2013-10-19 ENCOUNTER — Emergency Department (HOSPITAL_BASED_OUTPATIENT_CLINIC_OR_DEPARTMENT_OTHER)
Admission: EM | Admit: 2013-10-19 | Discharge: 2013-10-19 | Disposition: A | Discharge status: Home / Self Care | Payer: Self-pay | Attending: Emergency Medicine | Admitting: Emergency Medicine

## 2013-10-19 ENCOUNTER — Encounter (HOSPITAL_BASED_OUTPATIENT_CLINIC_OR_DEPARTMENT_OTHER): Payer: Self-pay | Admitting: Emergency Medicine

## 2013-10-19 DIAGNOSIS — M791 Myalgia: Secondary | ICD-10-CM | POA: Insufficient documentation

## 2013-10-19 DIAGNOSIS — Z79899 Other long term (current) drug therapy: Secondary | ICD-10-CM | POA: Insufficient documentation

## 2013-10-19 DIAGNOSIS — Z792 Long term (current) use of antibiotics: Secondary | ICD-10-CM | POA: Insufficient documentation

## 2013-10-19 DIAGNOSIS — Z791 Long term (current) use of non-steroidal anti-inflammatories (NSAID): Secondary | ICD-10-CM | POA: Insufficient documentation

## 2013-10-19 DIAGNOSIS — D649 Anemia, unspecified: Secondary | ICD-10-CM | POA: Insufficient documentation

## 2013-10-19 DIAGNOSIS — Z3202 Encounter for pregnancy test, result negative: Secondary | ICD-10-CM | POA: Insufficient documentation

## 2013-10-19 DIAGNOSIS — Z720 Tobacco use: Secondary | ICD-10-CM | POA: Insufficient documentation

## 2013-10-19 DIAGNOSIS — J45909 Unspecified asthma, uncomplicated: Secondary | ICD-10-CM | POA: Insufficient documentation

## 2013-10-19 DIAGNOSIS — M7918 Myalgia, other site: Secondary | ICD-10-CM

## 2013-10-19 LAB — URINALYSIS, ROUTINE W REFLEX MICROSCOPIC
BILIRUBIN URINE: NEGATIVE
GLUCOSE, UA: NEGATIVE mg/dL
KETONES UR: NEGATIVE mg/dL
Leukocytes, UA: NEGATIVE
Nitrite: NEGATIVE
PROTEIN: NEGATIVE mg/dL
Specific Gravity, Urine: 1.018 (ref 1.005–1.030)
Urobilinogen, UA: 1 mg/dL (ref 0.0–1.0)
pH: 6 (ref 5.0–8.0)

## 2013-10-19 LAB — URINE MICROSCOPIC-ADD ON

## 2013-10-19 LAB — PREGNANCY, URINE: PREG TEST UR: NEGATIVE

## 2013-10-19 MED ORDER — NAPROXEN 500 MG PO TABS: 500.0000 mg | ORAL_TABLET | Freq: Two times a day (BID) | ORAL | Status: DC

## 2013-10-19 MED ORDER — CYCLOBENZAPRINE HCL 10 MG PO TABS: 10.0000 mg | ORAL_TABLET | Freq: Two times a day (BID) | ORAL | Status: DC | PRN

## 2013-10-19 MED ORDER — NAPROXEN 250 MG PO TABS
500.0000 mg | ORAL_TABLET | Freq: Once | ORAL | Status: AC
  Administered 2013-10-19: 500 mg via ORAL
  Filled 2013-10-19: qty 2

## 2013-10-19 MED ORDER — CYCLOBENZAPRINE HCL 10 MG PO TABS
5.0000 mg | ORAL_TABLET | Freq: Once | ORAL | Status: AC
  Administered 2013-10-19: 5 mg via ORAL
  Filled 2013-10-19: qty 1

## 2013-10-19 NOTE — ED Provider Notes (Signed)
CSN: [id number]     Arrival date & time 10/19/13  [id number] History   First MD Initiated Contact with Patient 10/19/13 [phone]     Chief Complaint  Patient presents with  . Back Pain    right     (Consider location/radiation/quality/duration/timing/severity/associated sxs/prior Treatment) Patient is a 28 y.o. female presenting with back pain. The history is provided by the patient. No language interpreter was used.  Back Pain Pain location: R sided. Quality:  Aching Radiates to: from low up to top of R side of back. Pain severity:  Moderate Pain is:  Same all the time Onset quality:  Gradual Duration:  1 week Timing:  Intermittent Progression:  Waxing and waning Chronicity:  New Context: not emotional stress, not falling, not jumping from heights, not lifting heavy objects, not MCA, not MVA, not occupational injury, not pedestrian accident, not physical stress, not recent illness, not recent injury and not twisting   Relieved by:  Nothing Worsened by:  Standing Ineffective treatments:  Lying down (masage) Associated symptoms: no abdominal pain, no abdominal swelling, no bladder incontinence, no bowel incontinence, no chest pain, no dysuria, no fever, no headaches, no leg pain, no numbness, no paresthesias, no pelvic pain, no perianal numbness, no tingling, no weakness and no weight loss   Risk factors: lack of exercise   Risk factors: no hx of cancer     Past Medical History  Diagnosis Date  . Anemia   . Asthma    Past Surgical History  Procedure Laterality Date  . No past surgeries    . Tubal ligation  04/28/2011    Procedure: POST PARTUM TUBAL LIGATION;  Surgeon: Adam PhenixJames G Arnold, MD;  Location: [facility];  Service: Gynecology;  Laterality: Bilateral;   No family history on file. History  Substance Use Topics  . Smoking status: Current Every Day Smoker -- 0.25 packs/day    Types: Cigarettes  . Smokeless tobacco: Never Used  . Alcohol Use: No   OB History   Grav Para Term  Preterm Abortions TAB SAB Ect Mult Living   5 4 4  0 1  1  1 5      Review of Systems  Constitutional: Negative for fever, chills, weight loss, diaphoresis, activity change, appetite change and fatigue.  HENT: Negative for congestion, facial swelling, rhinorrhea and sore throat.   Eyes: Negative for photophobia and discharge.  Respiratory: Negative for cough, chest tightness and shortness of breath.   Cardiovascular: Negative for chest pain, palpitations and leg swelling.  Gastrointestinal: Negative for nausea, vomiting, abdominal pain, diarrhea and bowel incontinence.  Endocrine: Negative for polydipsia and polyuria.  Genitourinary: Negative for bladder incontinence, dysuria, frequency, difficulty urinating and pelvic pain.  Musculoskeletal: Positive for back pain. Negative for arthralgias, neck pain and neck stiffness.  Skin: Negative for color change and wound.  Allergic/Immunologic: Negative for immunocompromised state.  Neurological: Negative for tingling, facial asymmetry, weakness, numbness, headaches and paresthesias.  Hematological: Does not bruise/bleed easily.  Psychiatric/Behavioral: Negative for confusion and agitation.      Allergies  Review of patient's allergies indicates no known allergies.  Home Medications   Prior to Admission medications   Medication Sig Start Date End Date Taking? Authorizing Provider  amoxicillin (AMOXIL) 500 MG capsule Take 1 capsule (500 mg total) by mouth 3 (three) times daily. 04/02/13   Hope Orlene OchM Neese, NP  cyclobenzaprine (FLEXERIL) 10 MG tablet Take 1 tablet (10 mg total) by mouth 2 (two) times daily as needed for muscle spasms. 10/19/13  Toy Cookey, MD  ferrous gluconate (FERGON) 325 MG tablet Take 325 mg by mouth daily with breakfast.    Historical Provider, MD  HYDROcodone-acetaminophen (NORCO/VICODIN) 5-325 MG per tablet Take 2 tablets by mouth every 4 (four) hours as needed. 04/02/13   Hope Orlene Och, NP  ibuprofen (ADVIL,MOTRIN) 600 MG  tablet Take 1 tablet (600 mg total) by mouth every 6 (six) hours as needed. 04/02/13   Hope Orlene Och, NP  naproxen (NAPROSYN) 500 MG tablet Take 1 tablet (500 mg total) by mouth 2 (two) times daily. 10/19/13   Toy Cookey, MD  prenatal vitamin w/FE, FA (PRENATAL 1 + 1) 27-1 MG TABS Take 1 tablet by mouth daily.    Historical Provider, MD   BP 125/83  Pulse 88  Temp(Src) 98.4 F (36.9 C) (Oral)  Resp 18  Ht 5\' 4"  (1.626 m)  Wt 165 lb (74.844 kg)  BMI 28.31 kg/m2  SpO2 100%  LMP 10/12/2013 Physical Exam  Constitutional: She is oriented to person, place, and time. She appears well-developed and well-nourished. No distress.  HENT:  Head: Normocephalic and atraumatic.  Mouth/Throat: No oropharyngeal exudate.  Eyes: Pupils are equal, round, and reactive to light.  Neck: Normal range of motion. Neck supple.  Cardiovascular: Normal rate, regular rhythm and normal heart sounds.  Exam reveals no gallop and no friction rub.   No murmur heard. Pulmonary/Chest: Effort normal and breath sounds normal. No respiratory distress. She has no wheezes. She has no rales.  Abdominal: Soft. Bowel sounds are normal. She exhibits no distension and no mass. There is no tenderness. There is no rebound and no guarding.  Musculoskeletal: Normal range of motion. She exhibits no edema and no tenderness.       Arms: Neurological: She is alert and oriented to person, place, and time.  Skin: Skin is warm and dry.  Psychiatric: She has a normal mood and affect.    ED Course  Procedures (including critical care time) Labs Review Labs Reviewed  URINALYSIS, ROUTINE W REFLEX MICROSCOPIC - Abnormal; Notable for the following:    APPearance CLOUDY (*)    Hgb urine dipstick MODERATE (*)    All other components within normal limits  URINE MICROSCOPIC-ADD ON - Abnormal; Notable for the following:    Squamous Epithelial / LPF FEW (*)    Bacteria, UA FEW (*)    All other components within normal limits  URINE CULTURE    PREGNANCY, URINE    Imaging Review No results found.   EKG Interpretation None      MDM   Final diagnoses:  Musculoskeletal pain    Pt is a 28 y.o. female with Pmhx as above who presents with about 1 week of intermittent R sided low back pain radiating up the R side of back. Worse w/ mvmt. No numbness, weakness, n/v, incontinence, fever. No recent illness or injury.  On PE, VSS, pt in NAD. + tenderness over multiple muscle groups of R back. No spinal tenderness. UA not infected. Doubt cord compression. I feel pain is muscular. Will treat w/ naproxen/flexeril. Return precautions given for new or worsening symptoms including worsening pain, fever, numbness weakness.           Toy Cookey, MD 10/19/13 [phone]

## 2013-10-19 NOTE — ED Notes (Signed)
Reports right side lower back pain x a while. Reports no injury, denies urinary symptoms. Reports heat helps with pain

## 2013-10-19 NOTE — Discharge Instructions (Signed)
Back Pain, Adult Low back pain is very common. About 1 in 5 people have back pain.The cause of low back pain is rarely dangerous. The pain often gets better over time.About half of people with a sudden onset of back pain feel better in just 2 weeks. About 8 in 10 people feel better by 6 weeks.  CAUSES Some common causes of back pain include:  Strain of the muscles or ligaments supporting the spine.  Wear and tear (degeneration) of the spinal discs.  Arthritis.  Direct injury to the back. DIAGNOSIS Most of the time, the direct cause of low back pain is not known.However, back pain can be treated effectively even when the exact cause of the pain is unknown.Answering your caregiver's questions about your overall health and symptoms is one of the most accurate ways to make sure the cause of your pain is not dangerous. If your caregiver needs more information, he or she may order lab work or imaging tests (X-rays or MRIs).However, even if imaging tests show changes in your back, this usually does not require surgery. HOME CARE INSTRUCTIONS For many people, back pain returns.Since low back pain is rarely dangerous, it is often a condition that people can learn to manageon their own.   Remain active. It is stressful on the back to sit or stand in one place. Do not sit, drive, or stand in one place for more than 30 minutes at a time. Take short walks on level surfaces as soon as pain allows.Try to increase the length of time you walk each day.  Do not stay in bed.Resting more than 1 or 2 days can delay your recovery.  Do not avoid exercise or work.Your body is made to move.It is not dangerous to be active, even though your back may hurt.Your back will likely heal faster if you return to being active before your pain is gone.  Pay attention to your body when you bend and lift. Many people have less discomfortwhen lifting if they bend their knees, keep the load close to their bodies,and  avoid twisting. Often, the most comfortable positions are those that put less stress on your recovering back.  Find a comfortable position to sleep. Use a firm mattress and lie on your side with your knees slightly bent. If you lie on your back, put a pillow under your knees.  Only take over-the-counter or prescription medicines as directed by your caregiver. Over-the-counter medicines to reduce pain and inflammation are often the most helpful.Your caregiver may prescribe muscle relaxant drugs.These medicines help dull your pain so you can more quickly return to your normal activities and healthy exercise.  Put ice on the injured area.  Put ice in a plastic bag.  Place a towel between your skin and the bag.  Leave the ice on for 15-20 minutes, 03-04 times a day for the first 2 to 3 days. After that, ice and heat may be alternated to reduce pain and spasms.  Ask your caregiver about trying back exercises and gentle massage. This may be of some benefit.  Avoid feeling anxious or stressed.Stress increases muscle tension and can worsen back pain.It is important to recognize when you are anxious or stressed and learn ways to manage it.Exercise is a great option. SEEK MEDICAL CARE IF:  You have pain that is not relieved with rest or medicine.  You have pain that does not improve in 1 week.  You have new symptoms.  You are generally not feeling well. SEEK   IMMEDIATE MEDICAL CARE IF:   You have pain that radiates from your back into your legs.  You develop new bowel or bladder control problems.  You have unusual weakness or numbness in your arms or legs.  You develop nausea or vomiting.  You develop abdominal pain.  You feel faint. Document Released: 01/05/2005 Document Revised: 07/07/2011 Document Reviewed: 05/09/2013 Exit[employer] Patient Information 2015 [employer]. This information is not intended to replace advice given to you by your health care provider. Make sure you  discuss any questions you have with your health care provider.  

## 2013-10-20 LAB — URINE CULTURE: Colony Count: 75000

## 2013-11-20 ENCOUNTER — Encounter (HOSPITAL_BASED_OUTPATIENT_CLINIC_OR_DEPARTMENT_OTHER): Payer: Self-pay | Admitting: Emergency Medicine

## 2016-11-10 ENCOUNTER — Encounter (HOSPITAL_BASED_OUTPATIENT_CLINIC_OR_DEPARTMENT_OTHER): Payer: Self-pay | Admitting: Emergency Medicine

## 2016-11-10 ENCOUNTER — Emergency Department (HOSPITAL_BASED_OUTPATIENT_CLINIC_OR_DEPARTMENT_OTHER)
Admission: EM | Admit: 2016-11-10 | Discharge: 2016-11-10 | Disposition: A | Discharge status: Home / Self Care | Payer: Self-pay | Attending: Emergency Medicine | Admitting: Emergency Medicine

## 2016-11-10 DIAGNOSIS — Z79899 Other long term (current) drug therapy: Secondary | ICD-10-CM | POA: Insufficient documentation

## 2016-11-10 DIAGNOSIS — K1370 Unspecified lesions of oral mucosa: Secondary | ICD-10-CM | POA: Insufficient documentation

## 2016-11-10 DIAGNOSIS — F17210 Nicotine dependence, cigarettes, uncomplicated: Secondary | ICD-10-CM | POA: Insufficient documentation

## 2016-11-10 DIAGNOSIS — J45909 Unspecified asthma, uncomplicated: Secondary | ICD-10-CM | POA: Insufficient documentation

## 2016-11-10 MED ORDER — VALACYCLOVIR HCL 1 G PO TABS: 500.0000 mg | ORAL_TABLET | Freq: Two times a day (BID) | ORAL | 0 refills | Status: AC

## 2016-11-10 MED ORDER — HYDROXYZINE HCL 25 MG PO TABS: 25.0000 mg | ORAL_TABLET | Freq: Four times a day (QID) | ORAL | 0 refills | Status: AC

## 2016-11-10 NOTE — ED Provider Notes (Signed)
[facility] EMERGENCY DEPARTMENT Provider Note   CSN: [id number] Arrival date & time: 11/10/16  1632     History   Chief Complaint Chief Complaint  Patient presents with  . Oral Swelling    HPI Amy Mcdonald is a 31 y.o. female who presents with oral lesions and upper lip swelling.  Past medical history significant for cold sores.  She states that she woke up 2 days ago with the pain and swelling.  She initially thought it was consistent with cold sores since she does get these somewhat regularly however her lip became very swollen and she developed lesions on the above the upper lip.  She reports pain in burning and itching to the area.  She denies any known allergens that would cause this.  She denies any fever or URI symptoms.  She denies any difficulty swallowing or shortness of breath.  She has been using a warm water wash cloth with some relief.  Overall her symptoms are improving.  HPI  Past Medical History:  Diagnosis Date  . Anemia   . Asthma     Patient Active Problem List   Diagnosis Date Noted  . Twin pregnancy, delivered vaginally, current hospitalization 04/29/2011  . Supervision of high risk pregnancy in third trimester 04/02/2011  . Twin pregnancy, antepartum 03/31/2011  . Late prenatal care 03/31/2011    Past Surgical History:  Procedure Laterality Date  . NO PAST SURGERIES    . TUBAL LIGATION  04/28/2011   Procedure: POST PARTUM TUBAL LIGATION;  Surgeon: Adam Phenix, MD;  Location: [facility];  Service: Gynecology;  Laterality: Bilateral;    OB History    Gravida Para Term Preterm AB Living   5 4 4  0 1 5   SAB TAB Ectopic Multiple Live Births   1     1 3        Home Medications    Prior to Admission medications   Medication Sig Start Date End Date Taking? Authorizing Provider  amoxicillin (AMOXIL) 500 MG capsule Take 1 capsule (500 mg total) by mouth 3 (three) times daily. 04/02/13   Janne Napoleon, NP  cyclobenzaprine (FLEXERIL) 10  MG tablet Take 1 tablet (10 mg total) by mouth 2 (two) times daily as needed for muscle spasms. 10/19/13   Toy Cookey, MD  ferrous gluconate (FERGON) 325 MG tablet Take 325 mg by mouth daily with breakfast.    [provider]  HYDROcodone-acetaminophen (NORCO/VICODIN) 5-325 MG per tablet Take 2 tablets by mouth every 4 (four) hours as needed. 04/02/13   Janne Napoleon, NP  hydrOXYzine (ATARAX/VISTARIL) 25 MG tablet Take 1 tablet (25 mg total) by mouth every 6 (six) hours. 11/10/16   Bethel Born, PA-C  ibuprofen (ADVIL,MOTRIN) 600 MG tablet Take 1 tablet (600 mg total) by mouth every 6 (six) hours as needed. 04/02/13   Janne Napoleon, NP  naproxen (NAPROSYN) 500 MG tablet Take 1 tablet (500 mg total) by mouth 2 (two) times daily. 10/19/13   Toy Cookey, MD  prenatal vitamin w/FE, FA (PRENATAL 1 + 1) 27-1 MG TABS Take 1 tablet by mouth daily.    [provider]  valACYclovir (VALTREX) 1000 MG tablet Take 0.5 tablets (500 mg total) by mouth 2 (two) times daily. 11/10/16 11/24/16  Bethel Born, PA-C    Family History No family history on file.  Social History Social History  Substance Use Topics  . Smoking status: Current Every Day Smoker    Packs/day:  0.25    Types: Cigarettes  . Smokeless tobacco: Never Used  . Alcohol use No     Allergies   Patient has no known allergies.   Review of Systems Review of Systems  Constitutional: Negative for fever.  HENT: Positive for facial swelling. Negative for congestion, sinus pain, sore throat and trouble swallowing.        +oral and perioral lesions  Respiratory: Negative for shortness of breath.      Physical Exam Updated Vital Signs BP (!) 143/95 (BP Location: Left Arm)   Pulse 97   Temp 98.9 F (37.2 C) (Oral)   Resp 16   Ht 5\' 4"  (1.626 m)   Wt 79.4 kg (175 lb)   LMP 11/08/2016   SpO2 100%   BMI 30.04 kg/m   Physical Exam  Constitutional: She is oriented to person, place, and time. She  appears well-developed and well-nourished. No distress.  HENT:  Head: Normocephalic and atraumatic.  No visible lip swelling.  Patient speaking in full sentences.  Several oral lesions inside the upper lip.  And on the upper and lower lip on the outside. Photo was reviewed from several days ago which looked like vesicular lesions of the upper lip.  Eyes: Pupils are equal, round, and reactive to light. Conjunctivae are normal. Right eye exhibits no discharge. Left eye exhibits no discharge. No scleral icterus.  Neck: Normal range of motion.  Cardiovascular: Normal rate.   Pulmonary/Chest: Effort normal. No respiratory distress.  Abdominal: She exhibits no distension.  Neurological: She is alert and oriented to person, place, and time.  Skin: Skin is warm and dry.  Psychiatric: She has a normal mood and affect. Her behavior is normal.  Nursing note and vitals reviewed.    ED Treatments / Results  Labs (all labs ordered are listed, but only abnormal results are displayed) Labs Reviewed - No data to display  EKG  EKG Interpretation None       Radiology No results found.  Procedures Procedures (including critical care time)  Medications Ordered in ED Medications - No data to display   Initial Impression / Assessment and Plan / ED Course  I have reviewed the triage vital signs and the nursing notes.  Pertinent labs & imaging results that were available during my care of the patient were reviewed by me and considered in my medical decision making (see chart for details).  31 year old female with oral lesions which are resolving. She is hypertensive but otherwise vitals are normal. Exam is more consistent with herpes infection than allergic reaction. Symptoms are overall resolving. Will give rx for Valtrex and Atarax for itching as needed. Return precautions given.  Final Clinical Impressions(s) / ED Diagnoses   Final diagnoses:  Oral lesion    New Prescriptions New  Prescriptions   HYDROXYZINE (ATARAX/VISTARIL) 25 MG TABLET    Take 1 tablet (25 mg total) by mouth every 6 (six) hours.   VALACYCLOVIR (VALTREX) 1000 MG TABLET    Take 0.5 tablets (500 mg total) by mouth 2 (two) times daily.     Bethel BornGekas, Maryella Abood Marie, PA-C 11/10/16 1936    Lavera GuiseLiu, Dana Duo, MD 11/12/16 [phone]

## 2016-11-10 NOTE — ED Notes (Signed)
ED Provider at bedside. 

## 2016-11-10 NOTE — Discharge Instructions (Signed)
Take Valtrex twice a day for the next three days Take Hydroxyzine as needed for itching Return for worsening symptoms

## 2016-11-10 NOTE — ED Triage Notes (Signed)
Pt reports subjective upper lip swelling with pain since yesterday. States it is slightly improved right now. Denies swelling to tongue or SOB

## 2017-03-19 ENCOUNTER — Emergency Department (HOSPITAL_BASED_OUTPATIENT_CLINIC_OR_DEPARTMENT_OTHER)
Admission: EM | Admit: 2017-03-19 | Discharge: 2017-03-19 | Disposition: A | Discharge status: Home / Self Care | Payer: No Typology Code available for payment source | Attending: Emergency Medicine | Admitting: Emergency Medicine

## 2017-03-19 ENCOUNTER — Emergency Department (HOSPITAL_BASED_OUTPATIENT_CLINIC_OR_DEPARTMENT_OTHER): Payer: No Typology Code available for payment source

## 2017-03-19 ENCOUNTER — Other Ambulatory Visit: Payer: Self-pay

## 2017-03-19 ENCOUNTER — Encounter (HOSPITAL_BASED_OUTPATIENT_CLINIC_OR_DEPARTMENT_OTHER): Payer: Self-pay | Admitting: Emergency Medicine

## 2017-03-19 DIAGNOSIS — F17210 Nicotine dependence, cigarettes, uncomplicated: Secondary | ICD-10-CM | POA: Diagnosis not present

## 2017-03-19 DIAGNOSIS — J45909 Unspecified asthma, uncomplicated: Secondary | ICD-10-CM | POA: Diagnosis not present

## 2017-03-19 DIAGNOSIS — M549 Dorsalgia, unspecified: Secondary | ICD-10-CM | POA: Insufficient documentation

## 2017-03-19 DIAGNOSIS — Y92410 Unspecified street and highway as the place of occurrence of the external cause: Secondary | ICD-10-CM | POA: Insufficient documentation

## 2017-03-19 DIAGNOSIS — Y9389 Activity, other specified: Secondary | ICD-10-CM | POA: Diagnosis not present

## 2017-03-19 DIAGNOSIS — Z79899 Other long term (current) drug therapy: Secondary | ICD-10-CM | POA: Insufficient documentation

## 2017-03-19 DIAGNOSIS — Y998 Other external cause status: Secondary | ICD-10-CM | POA: Diagnosis not present

## 2017-03-19 LAB — PREGNANCY, URINE: Preg Test, Ur: NEGATIVE

## 2017-03-19 MED ORDER — METHOCARBAMOL 500 MG PO TABS: 500.0000 mg | ORAL_TABLET | Freq: Two times a day (BID) | ORAL | 0 refills | Status: AC

## 2017-03-19 MED ORDER — NAPROXEN 500 MG PO TABS: 500.0000 mg | ORAL_TABLET | Freq: Two times a day (BID) | ORAL | 0 refills | Status: AC
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
## 2017-10-08 ENCOUNTER — Emergency Department (HOSPITAL_BASED_OUTPATIENT_CLINIC_OR_DEPARTMENT_OTHER): Payer: Self-pay

## 2017-10-08 ENCOUNTER — Other Ambulatory Visit: Payer: Self-pay

## 2017-10-08 ENCOUNTER — Encounter (HOSPITAL_BASED_OUTPATIENT_CLINIC_OR_DEPARTMENT_OTHER): Payer: Self-pay | Admitting: *Deleted

## 2017-10-08 ENCOUNTER — Emergency Department (HOSPITAL_BASED_OUTPATIENT_CLINIC_OR_DEPARTMENT_OTHER)
Admission: EM | Admit: 2017-10-08 | Discharge: 2017-10-08 | Disposition: A | Discharge status: Home / Self Care | Payer: Self-pay | Attending: Emergency Medicine | Admitting: Emergency Medicine

## 2017-10-08 DIAGNOSIS — J45909 Unspecified asthma, uncomplicated: Secondary | ICD-10-CM | POA: Insufficient documentation

## 2017-10-08 DIAGNOSIS — F17210 Nicotine dependence, cigarettes, uncomplicated: Secondary | ICD-10-CM | POA: Insufficient documentation

## 2017-10-08 DIAGNOSIS — J020 Streptococcal pharyngitis: Secondary | ICD-10-CM | POA: Insufficient documentation

## 2017-10-08 LAB — GROUP A STREP BY PCR: GROUP A STREP BY PCR: DETECTED — AB

## 2017-10-08 MED ORDER — ONDANSETRON 4 MG PO TBDP
4.0000 mg | ORAL_TABLET | Freq: Once | ORAL | Status: AC
  Administered 2017-10-08: 4 mg via ORAL
  Filled 2017-10-08: qty 1

## 2017-10-08 MED ORDER — PENICILLIN G BENZATHINE 1200000 UNIT/2ML IM SUSP
1.2000 10*6.[IU] | Freq: Once | INTRAMUSCULAR | Status: AC
  Administered 2017-10-08: 1.2 10*6.[IU] via INTRAMUSCULAR
  Filled 2017-10-08: qty 2

## 2017-10-08 MED ORDER — IBUPROFEN 400 MG PO TABS
400.0000 mg | ORAL_TABLET | Freq: Once | ORAL | Status: AC
  Administered 2017-10-08: 400 mg via ORAL
  Filled 2017-10-08: qty 1

## 2017-10-08 MED ORDER — KETOROLAC TROMETHAMINE 60 MG/2ML IM SOLN
60.0000 mg | Freq: Once | INTRAMUSCULAR | Status: AC
  Administered 2017-10-08: 60 mg via INTRAMUSCULAR
  Filled 2017-10-08: qty 2

## 2017-10-08 MED ORDER — ACETAMINOPHEN 500 MG PO TABS
1000.0000 mg | ORAL_TABLET | Freq: Once | ORAL | Status: AC
  Administered 2017-10-08: 1000 mg via ORAL
  Filled 2017-10-08: qty 2

## 2017-10-08 MED ORDER — DEXAMETHASONE 6 MG PO TABS
10.0000 mg | ORAL_TABLET | Freq: Once | ORAL | Status: AC
  Administered 2017-10-08: 10 mg via ORAL
  Filled 2017-10-08: qty 1

## 2017-10-08 NOTE — ED Provider Notes (Signed)
Emergency Department Provider Note   I have reviewed the triage vital signs and the nursing notes.   HISTORY  Chief Complaint Generalized Body Aches   HPI Amy Mcdonald is a 32 y.o. female presents emerged department today with sore throat, body aches, chills, nausea and fever.  States she feels like she has the flu.  She has antibiotics that she knows of had the flu she has not had a flu shot this year.  She does have decreased p.o. intake because of the same.  No vomiting, diarrhea or constipation.  No rashes.  No recent travels. No other associated or modifying symptoms.    Past Medical History:  Diagnosis Date  . Anemia   . Asthma     Patient Active Problem List   Diagnosis Date Noted  . Twin pregnancy, delivered vaginally, current hospitalization 04/29/2011  . Supervision of high risk pregnancy in third trimester 04/02/2011  . Twin pregnancy, antepartum 03/31/2011  . Late prenatal care 03/31/2011    Past Surgical History:  Procedure Laterality Date  . NO PAST SURGERIES    . TUBAL LIGATION  04/28/2011   Procedure: POST PARTUM TUBAL LIGATION;  Surgeon: Adam Phenix, MD;  Location: [facility];  Service: Gynecology;  Laterality: Bilateral;    Current Outpatient Rx  . Order #: [id number] Class: Historical Med  . Order #: [id number] Class: Print  . Order #: [id number] Class: Print  . Order #: [id number] Class: Print  . Order #: [id number] Class: Print  . Order #: [id number] Class: Print  . Order #: [id number] Class: Historical Med    Allergies Patient has no known allergies.  No family history on file.  Social History Social History   Tobacco Use  . Smoking status: Current Every Day Smoker    Packs/day: 0.25    Types: Cigarettes  . Smokeless tobacco: Never Used  Substance Use Topics  . Alcohol use: No  . Drug use: No    Review of Systems  All other systems negative except as documented in the HPI. All pertinent positives and negatives as reviewed in the  HPI. ____________________________________________   PHYSICAL EXAM:  VITAL SIGNS: ED Triage Vitals  Enc Vitals Group     BP 10/08/17 1745 102/63     Pulse Rate 10/08/17 1745 (!) 105     Resp 10/08/17 1745 20     Temp 10/08/17 1745 (!) 101.8 F (38.8 C)     Temp Source 10/08/17 1745 Oral     SpO2 10/08/17 1745 100 %     Weight 10/08/17 1744 170 lb (77.1 kg)     Height 10/08/17 1744 5\' 4"  (1.626 m)    Constitutional: Alert and oriented. Well appearing and in no acute distress. Eyes: Conjunctivae are normal. PERRL. EOMI. Head: Atraumatic. Nose: No congestion/rhinnorhea. Mouth/Throat: Mucous membranes are moist.  Oropharynx erythematous with a small amount of exudates, tonsillar swelling.. Neck: No stridor.  No meningeal signs.   Cardiovascular: Normal rate, regular rhythm. Good peripheral circulation. Grossly normal heart sounds.   Respiratory: Normal respiratory effort.  No retractions. Lungs CTAB. Gastrointestinal: Soft and nontender. No distention.  Musculoskeletal: No lower extremity tenderness nor edema. No gross deformities of extremities. Neurologic:  Normal speech and language. No gross focal neurologic deficits are appreciated.  Skin:  Skin is warm, dry and intact. No rash noted.   ____________________________________________   LABS (all labs ordered are listed, but only abnormal results are displayed)  Labs Reviewed  GROUP A STREP BY PCR - Abnormal; Notable for the following components:  Result Value   Group A Strep by PCR DETECTED (*)    All other components within normal limits   ____________________________________________  RADIOLOGY  Dg Chest 2 View  Result Date: 10/08/2017 CLINICAL DATA:  Fever, cough and congestion with sore throat for 12 hours. EXAM: CHEST - 2 VIEW COMPARISON:  None. FINDINGS: The heart size and mediastinal contours are within normal limits. Both lungs are clear. The visualized skeletal structures are unremarkable. IMPRESSION: No  active cardiopulmonary disease. Electronically Signed   By: Elsie StainJohn T Curnes M.D.   On: 10/08/2017 18:57    ____________________________________________   PROCEDURES  Procedure(s) performed:   Procedures   ____________________________________________   INITIAL IMPRESSION / ASSESSMENT AND PLAN / ED COURSE  Strep throat without any evidence of complications point.  Penicillin given.  Decadron and Zofran for symptomatic control.     Pertinent labs & imaging results that were available during my care of the patient were reviewed by me and considered in my medical decision making (see chart for details).  ____________________________________________  FINAL CLINICAL IMPRESSION(S) / ED DIAGNOSES  Final diagnoses:  Strep throat     MEDICATIONS GIVEN DURING THIS VISIT:  Medications  ibuprofen (ADVIL,MOTRIN) tablet 400 mg (400 mg Oral Given 10/08/17 1750)  acetaminophen (TYLENOL) tablet 1,000 mg (1,000 mg Oral Given 10/08/17 1816)  penicillin g benzathine (BICILLIN LA) 1200000 UNIT/2ML injection 1.2 Million Units (1.2 Million Units Intramuscular Given 10/08/17 1945)  dexamethasone (DECADRON) tablet 10 mg (10 mg Oral Given 10/08/17 1941)  ketorolac (TORADOL) injection 60 mg (60 mg Intramuscular Given 10/08/17 1943)  ondansetron (ZOFRAN-ODT) disintegrating tablet 4 mg (4 mg Oral Given 10/08/17 1941)     NEW OUTPATIENT MEDICATIONS STARTED DURING THIS VISIT:  Discharge Medication List as of 10/08/2017  7:35 PM      Note:  This note was prepared with assistance of Dragon voice recognition software. Occasional wrong-word or sound-a-like substitutions may have occurred due to the inherent limitations of voice recognition software.   Marily MemosMesner, Salinda Snedeker, MD 10/08/17 2126

## 2017-10-08 NOTE — ED Notes (Signed)
ED Provider at bedside. 

## 2017-10-08 NOTE — ED Triage Notes (Signed)
Body aches, chills, nausea since this am. States she feels like she has the flu.

## 2018-01-02 ENCOUNTER — Emergency Department (HOSPITAL_COMMUNITY)
Admission: EM | Admit: 2018-01-02 | Discharge: 2018-01-02 | Disposition: A | Discharge status: Home / Self Care | Payer: Self-pay | Attending: Emergency Medicine | Admitting: Emergency Medicine

## 2018-01-02 ENCOUNTER — Emergency Department (HOSPITAL_COMMUNITY): Payer: Self-pay

## 2018-01-02 ENCOUNTER — Encounter (HOSPITAL_COMMUNITY): Payer: Self-pay | Admitting: *Deleted

## 2018-01-02 ENCOUNTER — Other Ambulatory Visit: Payer: Self-pay

## 2018-01-02 DIAGNOSIS — A599 Trichomoniasis, unspecified: Secondary | ICD-10-CM

## 2018-01-02 DIAGNOSIS — B9689 Other specified bacterial agents as the cause of diseases classified elsewhere: Secondary | ICD-10-CM

## 2018-01-02 DIAGNOSIS — F17210 Nicotine dependence, cigarettes, uncomplicated: Secondary | ICD-10-CM | POA: Insufficient documentation

## 2018-01-02 DIAGNOSIS — N760 Acute vaginitis: Secondary | ICD-10-CM | POA: Insufficient documentation

## 2018-01-02 DIAGNOSIS — K5900 Constipation, unspecified: Secondary | ICD-10-CM

## 2018-01-02 DIAGNOSIS — J45909 Unspecified asthma, uncomplicated: Secondary | ICD-10-CM | POA: Insufficient documentation

## 2018-01-02 LAB — URINALYSIS, ROUTINE W REFLEX MICROSCOPIC
Bilirubin Urine: NEGATIVE
GLUCOSE, UA: NEGATIVE mg/dL
Hgb urine dipstick: NEGATIVE
KETONES UR: 5 mg/dL — AB
NITRITE: NEGATIVE
PH: 5 (ref 5.0–8.0)
Protein, ur: NEGATIVE mg/dL
Specific Gravity, Urine: 1.028 (ref 1.005–1.030)

## 2018-01-02 LAB — CBC
HCT: 41.4 % (ref 36.0–46.0)
Hemoglobin: 13.2 g/dL (ref 12.0–15.0)
MCH: 30.1 pg (ref 26.0–34.0)
MCHC: 31.9 g/dL (ref 30.0–36.0)
MCV: 94.3 fL (ref 80.0–100.0)
NRBC: 0 % (ref 0.0–0.2)
Platelets: 352 10*3/uL (ref 150–400)
RBC: 4.39 MIL/uL (ref 3.87–5.11)
RDW: 12.5 % (ref 11.5–15.5)
WBC: 11 10*3/uL — AB (ref 4.0–10.5)

## 2018-01-02 LAB — WET PREP, GENITAL
Sperm: NONE SEEN
Yeast Wet Prep HPF POC: NONE SEEN

## 2018-01-02 LAB — COMPREHENSIVE METABOLIC PANEL
ALT: 13 U/L (ref 0–44)
AST: 16 U/L (ref 15–41)
Albumin: 4.1 g/dL (ref 3.5–5.0)
Alkaline Phosphatase: 44 U/L (ref 38–126)
Anion gap: 13 (ref 5–15)
BUN: 11 mg/dL (ref 6–20)
CALCIUM: 8.8 mg/dL — AB (ref 8.9–10.3)
CHLORIDE: 100 mmol/L (ref 98–111)
CO2: 24 mmol/L (ref 22–32)
CREATININE: 0.83 mg/dL (ref 0.44–1.00)
Glucose, Bld: 94 mg/dL (ref 70–99)
Potassium: 3.5 mmol/L (ref 3.5–5.1)
Sodium: 137 mmol/L (ref 135–145)
Total Bilirubin: 0.4 mg/dL (ref 0.3–1.2)
Total Protein: 7.4 g/dL (ref 6.5–8.1)

## 2018-01-02 LAB — POC URINE PREG, ED: Preg Test, Ur: NEGATIVE

## 2018-01-02 MED ORDER — DOCUSATE SODIUM 250 MG PO CAPS: 250.0000 mg | ORAL_CAPSULE | Freq: Every day | ORAL | 0 refills | Status: AC

## 2018-01-02 MED ORDER — IOHEXOL 300 MG/ML  SOLN
100.0000 mL | Freq: Once | INTRAMUSCULAR | Status: AC | PRN
  Administered 2018-01-02: 100 mL via INTRAVENOUS

## 2018-01-02 MED ORDER — METRONIDAZOLE 500 MG PO TABS: 500.0000 mg | ORAL_TABLET | Freq: Two times a day (BID) | ORAL | 0 refills | Status: AC

## 2018-01-02 MED ORDER — KETOROLAC TROMETHAMINE 30 MG/ML IJ SOLN
30.0000 mg | Freq: Once | INTRAMUSCULAR | Status: AC
  Administered 2018-01-02: 30 mg via INTRAVENOUS
  Filled 2018-01-02: qty 1

## 2018-01-02 MED ORDER — MORPHINE SULFATE (PF) 4 MG/ML IV SOLN
4.0000 mg | Freq: Once | INTRAVENOUS | Status: AC
  Administered 2018-01-02: 4 mg via INTRAVENOUS
  Filled 2018-01-02: qty 1

## 2018-01-02 MED ORDER — POLYETHYLENE GLYCOL 3350 17 G PO PACK: 17.0000 g | PACK | Freq: Every day | ORAL | 0 refills | Status: AC

## 2018-01-02 NOTE — Discharge Instructions (Addendum)
Take Flagyl to treat trichomonas and bacterial vaginosis until completed.  Make sure your sexual partner(s) are treated for trichomonas, as men oftentimes do not exhibit symptoms, but can still transmit it to you.  Take MiraLAX up to 4 days in a row to help reduce regular bowel movements.  Take Colace to help soften your stools.  Try to increase your fiber.  Make sure to stay well-hydrated.  Please return the emergency department if you develop any new or worsening symptoms.  Please follow-up and establish care with a primary care provider by calling the number outlined in black on your paperwork.

## 2018-01-02 NOTE — ED Triage Notes (Signed)
Pt c/o sudden onset of RLQ pain tonight. Denies vag discharge or bleeding, denies NV. Took OTC pain meds pta without improvement. Pain worsens with movement. Pt appears very uncomfortable

## 2018-01-02 NOTE — ED Provider Notes (Signed)
  Physical Exam  BP 105/64   Pulse 87   Temp 98.1 F (36.7 C) (Oral)   Resp 20   Ht 5\' 4"  (1.626 m)   Wt 77.1 kg   LMP 12/29/2017   SpO2 98%   BMI 29.18 kg/m   Physical Exam  ED Course/Procedures     Procedures  MDM  Sign out received from [employer] law, PA.  Waiting on pelvic ultrasound to rule out torsion.  Patient resting comfortably in a chair, no acute distress, nontoxic, non-lethargic.  Vital signs stable.  Abdomen remains soft.  She is minimally tender but states it is improved from arrival. CT shows no acute findings, no perforation, no obstruction, no abscess.  Ultrasound shows no abscess, no torsion or acute findings.  Possible etiology of patient's abdominal pain is trichomonas, bacterial vaginosis and constipation.  Prescriptions were written for Flagyl, Colace and MiraLAX.  Patient stable for discharge at this time.  Patient given strict return precautions, and she expresses understanding.  Encourage close follow-up.   At this time there does not appear to be any evidence of an acute emergency medical condition and the patient appears stable for discharge with appropriate outpatient follow up.Diagnosis was discussed with patient who verbalizes understanding and is agreeable to discharge.      Clayborne ArtistKendrick, Theophile Harvie S, PA-C 01/02/18 1155    Eber HongMiller, Brian, MD 01/03/18 Ernestina [id number]

## 2018-01-02 NOTE — ED Provider Notes (Addendum)
[facility] EMERGENCY DEPARTMENT Provider Note   CSN: [id number] Arrival date & time: 01/02/18  0345     History   Chief Complaint Chief Complaint  Patient presents with  . Abdominal Pain    HPI Amy Mcdonald is a 32 y.o. female with history of anemia and asthma who presents with sudden onset right lower quadrant pain while patient was at work [employer].  She has had constant pain since 2 or 230 this morning.  She reports she was cooking at work when this happened.  She has had no other associated symptoms including nausea, vomiting, diarrhea, fever, urinary symptoms, abnormal vaginal bleeding or discharge.  Patient had a normal menstrual cycle last week.  She has a tubal ligation.  She is sexually active with one partner for 15 years.  She denies any concern for STD exposure.  She has no history of abdominal surgeries.  She took Tylenol prior to arrival without relief.  HPI  Past Medical History:  Diagnosis Date  . Anemia   . Asthma     Patient Active Problem List   Diagnosis Date Noted  . Twin pregnancy, delivered vaginally, current hospitalization 04/29/2011  . Supervision of high risk pregnancy in third trimester 04/02/2011  . Twin pregnancy, antepartum 03/31/2011  . Late prenatal care 03/31/2011    Past Surgical History:  Procedure Laterality Date  . NO PAST SURGERIES    . TUBAL LIGATION  04/28/2011   Procedure: POST PARTUM TUBAL LIGATION;  Surgeon: Adam Phenix, MD;  Location: [facility];  Service: Gynecology;  Laterality: Bilateral;     OB History    Gravida  5   Para  4   Term  4   Preterm  0   AB  1   Living  5     SAB  1   TAB      Ectopic      Multiple  1   Live Births  3            Home Medications    Prior to Admission medications   Medication Sig Start Date End Date Taking? Authorizing Provider  docusate sodium (COLACE) 250 MG capsule Take 1 capsule (250 mg total) by mouth daily. 01/02/18   Emi Holes,  PA-C  HYDROcodone-acetaminophen (NORCO/VICODIN) 5-325 MG per tablet Take 2 tablets by mouth every 4 (four) hours as needed. Patient not taking: Reported on 01/02/2018 04/02/13   Janne Napoleon, NP  hydrOXYzine (ATARAX/VISTARIL) 25 MG tablet Take 1 tablet (25 mg total) by mouth every 6 (six) hours. Patient not taking: Reported on 01/02/2018 11/10/16   Bethel Born, PA-C  ibuprofen (ADVIL,MOTRIN) 600 MG tablet Take 1 tablet (600 mg total) by mouth every 6 (six) hours as needed. Patient not taking: Reported on 01/02/2018 04/02/13   Janne Napoleon, NP  methocarbamol (ROBAXIN) 500 MG tablet Take 1 tablet (500 mg total) by mouth 2 (two) times daily. Patient not taking: Reported on 01/02/2018 03/19/17   Emi Holes, PA-C  metroNIDAZOLE (FLAGYL) 500 MG tablet Take 1 tablet (500 mg total) by mouth 2 (two) times daily. 01/02/18   Corrine Tillis, Waylan Boga, PA-C  naproxen (NAPROSYN) 500 MG tablet Take 1 tablet (500 mg total) by mouth 2 (two) times daily. Patient not taking: Reported on 01/02/2018 03/19/17   Emi Holes, PA-C  polyethylene glycol [facility]) packet Take 17 g by mouth daily. 01/02/18   Emi Holes, PA-C    Family History No family  history on file.  Social History Social History   Tobacco Use  . Smoking status: Current Every Day Smoker    Packs/day: 0.25    Types: Cigarettes  . Smokeless tobacco: Never Used  Substance Use Topics  . Alcohol use: No  . Drug use: No     Allergies   Patient has no known allergies.   Review of Systems Review of Systems  Constitutional: Negative for chills and fever.  HENT: Negative for facial swelling and sore throat.   Respiratory: Negative for shortness of breath.   Cardiovascular: Negative for chest pain.  Gastrointestinal: Positive for abdominal pain. Negative for nausea and vomiting.  Genitourinary: Negative for dysuria, vaginal bleeding and vaginal discharge.  Musculoskeletal: Negative for back pain.  Skin: Negative for rash and  wound.  Neurological: Negative for headaches.  Psychiatric/Behavioral: The patient is not nervous/anxious.      Physical Exam Updated Vital Signs BP 105/64   Pulse 87   Temp 98.1 F (36.7 C) (Oral)   Resp 20   Ht 5\' 4"  (1.626 m)   Wt 77.1 kg   LMP 12/29/2017   SpO2 98%   BMI 29.18 kg/m   Physical Exam Vitals signs and nursing note reviewed.  Constitutional:      General: She is not in acute distress.    Appearance: She is well-developed. She is not diaphoretic.  HENT:     Head: Normocephalic and atraumatic.     Mouth/Throat:     Pharynx: No oropharyngeal exudate.  Eyes:     General: No scleral icterus.       Right eye: No discharge.        Left eye: No discharge.     Conjunctiva/sclera: Conjunctivae normal.     Pupils: Pupils are equal, round, and reactive to light.  Neck:     Musculoskeletal: Normal range of motion and neck supple.     Thyroid: No thyromegaly.  Cardiovascular:     Rate and Rhythm: Normal rate and regular rhythm.     Heart sounds: Normal heart sounds. No murmur. No friction rub. No gallop.   Pulmonary:     Effort: Pulmonary effort is normal. No respiratory distress.     Breath sounds: Normal breath sounds. No stridor. No wheezing or rales.  Abdominal:     General: Bowel sounds are normal. There is no distension.     Palpations: Abdomen is soft.     Tenderness: There is abdominal tenderness in the right lower quadrant. There is no right CVA tenderness, left CVA tenderness, guarding or rebound.  Genitourinary:    Cervix: Discharge (clear/white) present. No cervical motion tenderness.     Uterus: Normal.      Adnexa:        Right: No tenderness.         Left: No tenderness.       Comments: Chaperone present Lymphadenopathy:     Cervical: No cervical adenopathy.  Skin:    General: Skin is warm and dry.     Coloration: Skin is not pale.     Findings: No rash.  Neurological:     Mental Status: She is alert.     Coordination: Coordination  normal.      ED Treatments / Results  Labs (all labs ordered are listed, but only abnormal results are displayed) Labs Reviewed  WET PREP, GENITAL - Abnormal; Notable for the following components:      Result Value   Trich, Wet Prep PRESENT (*)  Clue Cells Wet Prep HPF POC PRESENT (*)    WBC, Wet Prep HPF POC MANY (*)    All other components within normal limits  COMPREHENSIVE METABOLIC PANEL - Abnormal; Notable for the following components:   Calcium 8.8 (*)    All other components within normal limits  CBC - Abnormal; Notable for the following components:   WBC 11.0 (*)    All other components within normal limits  URINALYSIS, ROUTINE W REFLEX MICROSCOPIC - Abnormal; Notable for the following components:   APPearance HAZY (*)    Ketones, ur 5 (*)    Leukocytes, UA LARGE (*)    Bacteria, UA RARE (*)    All other components within normal limits  URINE CULTURE  POC URINE PREG, ED  GC/CHLAMYDIA PROBE AMP ([place]    EKG None  Radiology Ct Abdomen Pelvis W Contrast  Result Date: 01/02/2018 CLINICAL DATA:  Acute onset RIGHT lower quadrant pain. Suspect appendicitis. EXAM: CT ABDOMEN AND PELVIS WITH CONTRAST TECHNIQUE: Multidetector CT imaging of the abdomen and pelvis was performed using the standard protocol following bolus administration of intravenous contrast. CONTRAST:  100mL OMNIPAQUE IOHEXOL 300 MG/ML  SOLN COMPARISON:  Abdominal radiographs March 19, 2017 FINDINGS: LOWER CHEST: Lung bases are clear. Included heart size is normal. No pericardial effusion. Mild gas distended distal esophagus seen with reflux. Small HEPATOBILIARY: Liver and gallbladder are normal. PANCREAS: Normal. SPLEEN: Normal. ADRENALS/URINARY TRACT: Kidneys are orthotopic, demonstrating symmetric enhancement. No nephrolithiasis, hydronephrosis or solid renal masses. The unopacified ureters are normal in course and caliber. Urinary bladder is well distended and unremarkable. Normal  adrenal glands. STOMACH/BOWEL: The stomach, small and large bowel are normal in course and caliber without inflammatory changes, sensitivity decreased without oral contrast. Moderate volume retained large bowel stool. Normal appendix. VASCULAR/LYMPHATIC: Aortoiliac vessels are normal in course and caliber. No lymphadenopathy by CT size criteria. REPRODUCTIVE: Normal.  Migrated LEFT tubal ligation clip. OTHER: No intraperitoneal free fluid or free air. MUSCULOSKELETAL: Nonacute.  Mild sacroiliac osteoarthrosis. IMPRESSION: 1. Moderate amount of retained large bowel stool without bowel obstruction or acute intra-abdominal/pelvic process. Normal appendix. 2. Migrated LEFT tubal ligation clip. Electronically Signed   By: Awilda Metroourtnay  Bloomer M.D.   On: 01/02/2018 06:23    Procedures Procedures (including critical care time)  Medications Ordered in ED Medications  ketorolac (TORADOL) 30 MG/ML injection 30 mg (has no administration in time range)  morphine 4 MG/ML injection 4 mg (4 mg Intravenous Given 01/02/18 0439)  iohexol (OMNIPAQUE) 300 MG/ML solution 100 mL (100 mLs Intravenous Contrast Given 01/02/18 0556)     Initial Impression / Assessment and Plan / ED Course  I have reviewed the triage vital signs and the nursing notes.  Pertinent labs & imaging results that were available during my care of the patient were reviewed by me and considered in my medical decision making (see chart for details).     Patient with right lower quadrant pain.  Initial concern for appendicitis versus ovarian torsion.  CBC shows WBC 11.0.  CMP normal other than calcium 8.8.  UA shows large leukocytes, 6-10 WBCs, 11-20 squamous epithelial cells.  Considering no urinary symptoms, will not treat at this time, but will send culture.  Low suspicion that this is the cause of patient's pain.  Patient positive for trichomonas and BV.  Will treat with Flagyl.  Patient advised to have her sexual partners treated for trichomonas as  well. CT abdomen pelvis shows moderate stool burden, but normal appendicitis.  Also shows migrated left tubal ligation clip. Although reproductive organs were read as normal on CT, however, considering patient's pain out of proportion for other findings, will rule out ovarian torsion with Doppler ultrasound.  This is pending at shift change and care will be transferred to [facility], PA-C, who will determine disposition following ultrasound.  If negative, will treat with Flagyl and MiraLAX and Colace.  Final Clinical Impressions(s) / ED Diagnoses   Final diagnoses:  Constipation, unspecified constipation type  Trichomonas infection  BV (bacterial vaginosis)    ED Discharge Orders         Ordered    metroNIDAZOLE (FLAGYL) 500 MG tablet  2 times daily     01/02/18 0644    polyethylene glycol (MIRALAX) packet  Daily     01/02/18 0644    docusate sodium (COLACE) 250 MG capsule  Daily     01/02/18 0644               Emi Holes, PA-C 01/02/18 1610    Derwood Kaplan, MD 01/02/18 2311

## 2018-01-03 LAB — URINE CULTURE: Culture: <10000 — AB

## 2018-01-03 LAB — GC/CHLAMYDIA PROBE AMP (~~LOC~~) NOT AT ARMC
Chlamydia: NEGATIVE
Neisseria Gonorrhea: NEGATIVE

## 2018-07-18 ENCOUNTER — Encounter (HOSPITAL_BASED_OUTPATIENT_CLINIC_OR_DEPARTMENT_OTHER): Payer: Self-pay | Admitting: *Deleted

## 2018-07-18 ENCOUNTER — Emergency Department (HOSPITAL_BASED_OUTPATIENT_CLINIC_OR_DEPARTMENT_OTHER)
Admission: EM | Admit: 2018-07-18 | Discharge: 2018-07-18 | Disposition: A | Discharge status: Home / Self Care | Payer: Self-pay | Attending: Emergency Medicine | Admitting: Emergency Medicine

## 2018-07-18 ENCOUNTER — Other Ambulatory Visit: Payer: Self-pay

## 2018-07-18 DIAGNOSIS — J45909 Unspecified asthma, uncomplicated: Secondary | ICD-10-CM | POA: Insufficient documentation

## 2018-07-18 DIAGNOSIS — F17210 Nicotine dependence, cigarettes, uncomplicated: Secondary | ICD-10-CM | POA: Insufficient documentation

## 2018-07-18 DIAGNOSIS — Z79899 Other long term (current) drug therapy: Secondary | ICD-10-CM | POA: Insufficient documentation

## 2018-07-18 DIAGNOSIS — K047 Periapical abscess without sinus: Secondary | ICD-10-CM | POA: Insufficient documentation

## 2018-07-18 MED ORDER — IBUPROFEN 800 MG PO TABS: 800.0000 mg | ORAL_TABLET | Freq: Three times a day (TID) | ORAL | 0 refills | Status: AC | PRN

## 2018-07-18 MED ORDER — HYDROCODONE-ACETAMINOPHEN 5-325 MG PO TABS: 1.0000 | ORAL_TABLET | Freq: Four times a day (QID) | ORAL | 0 refills | Status: AC | PRN

## 2018-07-18 MED ORDER — PENICILLIN V POTASSIUM 500 MG PO TABS: 500.0000 mg | ORAL_TABLET | Freq: Four times a day (QID) | ORAL | 0 refills | Status: AC

## 2018-07-18 NOTE — ED Triage Notes (Signed)
Dental pain for a month.

## 2018-07-18 NOTE — ED Provider Notes (Signed)
[facility] EMERGENCY DEPARTMENT Provider Note   CSN: [id number] Arrival date & time: 07/18/18  1704     History   Chief Complaint Chief Complaint  Patient presents with  . Dental Pain    HPI Amy Mcdonald is a 33 y.o. female.     HPI Patient presents to the emergency department with dental pain and loose molar.  The patient states that the pain started several days ago and the tooth is fairly loose.  The patient states that the gum is also swollen and irritated.  Patient denies any difficulty swallowing, difficulty breathing, nausea, vomiting, fever, weakness or syncope. Past Medical History:  Diagnosis Date  . Anemia   . Asthma     Patient Active Problem List   Diagnosis Date Noted  . Twin pregnancy, delivered vaginally, current hospitalization 04/29/2011  . Supervision of high risk pregnancy in third trimester 04/02/2011  . Twin pregnancy, antepartum 03/31/2011  . Late prenatal care 03/31/2011    Past Surgical History:  Procedure Laterality Date  . NO PAST SURGERIES    . TUBAL LIGATION  04/28/2011   Procedure: POST PARTUM TUBAL LIGATION;  Surgeon: Adam PhenixJames G Arnold, MD;  Location: [facility];  Service: Gynecology;  Laterality: Bilateral;     OB History    Gravida  5   Para  4   Term  4   Preterm  0   AB  1   Living  5     SAB  1   TAB      Ectopic      Multiple  1   Live Births  3            Home Medications    Prior to Admission medications   Medication Sig Start Date End Date Taking? Authorizing Provider  docusate sodium (COLACE) 250 MG capsule Take 1 capsule (250 mg total) by mouth daily. 01/02/18   Emi HolesLaw, Alexandra M, PA-C  HYDROcodone-acetaminophen (NORCO/VICODIN) 5-325 MG per tablet Take 2 tablets by mouth every 4 (four) hours as needed. Patient not taking: Reported on 01/02/2018 04/02/13   Janne NapoleonNeese, Hope M, NP  hydrOXYzine (ATARAX/VISTARIL) 25 MG tablet Take 1 tablet (25 mg total) by mouth every 6 (six) hours. Patient not  taking: Reported on 01/02/2018 11/10/16   Bethel BornGekas, Kelly Marie, PA-C  ibuprofen (ADVIL,MOTRIN) 600 MG tablet Take 1 tablet (600 mg total) by mouth every 6 (six) hours as needed. Patient not taking: Reported on 01/02/2018 04/02/13   Janne NapoleonNeese, Hope M, NP  methocarbamol (ROBAXIN) 500 MG tablet Take 1 tablet (500 mg total) by mouth 2 (two) times daily. Patient not taking: Reported on 01/02/2018 03/19/17   Emi HolesLaw, Alexandra M, PA-C  metroNIDAZOLE (FLAGYL) 500 MG tablet Take 1 tablet (500 mg total) by mouth 2 (two) times daily. 01/02/18   Law, Waylan BogaAlexandra M, PA-C  naproxen (NAPROSYN) 500 MG tablet Take 1 tablet (500 mg total) by mouth 2 (two) times daily. Patient not taking: Reported on 01/02/2018 03/19/17   Emi HolesLaw, Alexandra M, PA-C  polyethylene glycol [facility](MIRALAX) packet Take 17 g by mouth daily. 01/02/18   Emi HolesLaw, Alexandra M, PA-C    Family History No family history on file.  Social History Social History   Tobacco Use  . Smoking status: Current Every Day Smoker    Packs/day: 0.25    Types: Cigarettes  . Smokeless tobacco: Never Used  Substance Use Topics  . Alcohol use: No  . Drug use: No     Allergies   Patient has  no known allergies.   Review of Systems Review of Systems All other systems negative except as documented in the HPI. All pertinent positives and negatives as reviewed in the HPI.  Physical Exam Updated Vital Signs BP 137/84   Pulse 85   Temp 98.9 F (37.2 C) (Oral)   Resp 18   Ht 5\' 4"  (1.626 m)   Wt 84.7 kg   LMP 06/29/2018   SpO2 100%   BMI 32.06 kg/m   Physical Exam Vitals signs and nursing note reviewed.  Constitutional:      General: She is not in acute distress.    Appearance: She is well-developed.  HENT:     Head: Normocephalic and atraumatic.     Mouth/Throat:   Eyes:     Pupils: Pupils are equal, round, and reactive to light.  Pulmonary:     Effort: Pulmonary effort is normal.  Skin:    General: Skin is warm and dry.  Neurological:     Mental Status:  She is alert and oriented to person, place, and time.      ED Treatments / Results  Labs (all labs ordered are listed, but only abnormal results are displayed) Labs Reviewed - No data to display  EKG    Radiology No results found.  Procedures Procedures (including critical care time)  Medications Ordered in ED Medications - No data to display   Initial Impression / Assessment and Plan / ED Course  I have reviewed the triage vital signs and the nursing notes.  Pertinent labs & imaging results that were available during my care of the patient were reviewed by me and considered in my medical decision making (see chart for details).        I explained to the patient that we do not pull teeth in the emergency department.  I advised her that she will need to follow-up with the dentist provided we will give her pain control along with antibiotics.  Told to return here as needed.  Told to rinse with warm water peroxide 3 times a day.  Final Clinical Impressions(s) / ED Diagnoses   Final diagnoses:  None    ED Discharge Orders    None       Dalia Heading, PA-C 07/18/18 1717    Blanchie Dessert, MD 07/18/18 2115

## 2018-07-18 NOTE — Discharge Instructions (Signed)
Follow-up with the dentist provided.  Rinse with warm water and peroxide 3 times a day.  Here as needed.

## 2019-09-08 ENCOUNTER — Emergency Department (HOSPITAL_BASED_OUTPATIENT_CLINIC_OR_DEPARTMENT_OTHER): Payer: Self-pay

## 2019-09-08 ENCOUNTER — Other Ambulatory Visit: Payer: Self-pay

## 2019-09-08 ENCOUNTER — Encounter (HOSPITAL_BASED_OUTPATIENT_CLINIC_OR_DEPARTMENT_OTHER): Payer: Self-pay | Admitting: *Deleted

## 2019-09-08 ENCOUNTER — Emergency Department (HOSPITAL_BASED_OUTPATIENT_CLINIC_OR_DEPARTMENT_OTHER)
Admission: EM | Admit: 2019-09-08 | Discharge: 2019-09-08 | Disposition: A | Discharge status: Home / Self Care | Payer: Self-pay | Attending: Emergency Medicine | Admitting: Emergency Medicine

## 2019-09-08 DIAGNOSIS — J45909 Unspecified asthma, uncomplicated: Secondary | ICD-10-CM | POA: Insufficient documentation

## 2019-09-08 DIAGNOSIS — N3091 Cystitis, unspecified with hematuria: Secondary | ICD-10-CM | POA: Insufficient documentation

## 2019-09-08 DIAGNOSIS — N3001 Acute cystitis with hematuria: Secondary | ICD-10-CM

## 2019-09-08 DIAGNOSIS — F17210 Nicotine dependence, cigarettes, uncomplicated: Secondary | ICD-10-CM | POA: Insufficient documentation

## 2019-09-08 LAB — URINALYSIS, ROUTINE W REFLEX MICROSCOPIC
Bilirubin Urine: NEGATIVE
Glucose, UA: NEGATIVE mg/dL
Ketones, ur: NEGATIVE mg/dL
Nitrite: NEGATIVE
Protein, ur: 30 mg/dL — AB
Specific Gravity, Urine: 1.025 (ref 1.005–1.030)
pH: 5.5 (ref 5.0–8.0)

## 2019-09-08 LAB — URINALYSIS, MICROSCOPIC (REFLEX)

## 2019-09-08 LAB — PREGNANCY, URINE: Preg Test, Ur: NEGATIVE

## 2019-09-08 MED ORDER — CEPHALEXIN 500 MG PO CAPS: 500.0000 mg | ORAL_CAPSULE | Freq: Four times a day (QID) | ORAL | 0 refills | Status: AC

## 2019-09-08 MED ORDER — CEPHALEXIN 250 MG PO CAPS
500.0000 mg | ORAL_CAPSULE | Freq: Once | ORAL | Status: AC
  Administered 2019-09-08: 500 mg via ORAL
  Filled 2019-09-08: qty 2

## 2019-09-08 MED FILL — CEPHALEXIN 500 MG CAPSULE: 500 | 7 days supply | Qty: 28 | Fill #0

## 2019-09-08 NOTE — ED Provider Notes (Signed)
[facility] EMERGENCY DEPARTMENT Provider Note   CSN: [id number] Arrival date & time: 09/08/19  1328     History Chief Complaint  Patient presents with  . Dysuria    Amy Mcdonald is a 34 y.o. female who presents for intermittent dysuria and hematuria x 3 days. She reports that she noticed a small amount of blood when she urinated about 3 days ago.  She states she also noted some dysuria as well as increased urge to urinate.  She is at the hematuria cleared up but then returned today, prompting ED visit.  She states she has had some mild pain in the right side and mid lower abdomen.  She states that she has not had any fevers, nausea/vomiting.  The history is provided by the patient.       Past Medical History:  Diagnosis Date  . Anemia   . Asthma     Patient Active Problem List   Diagnosis Date Noted  . Twin pregnancy, delivered vaginally, current hospitalization 04/29/2011  . Supervision of high risk pregnancy in third trimester 04/02/2011  . Twin pregnancy, antepartum 03/31/2011  . Late prenatal care 03/31/2011    Past Surgical History:  Procedure Laterality Date  . NO PAST SURGERIES    . TUBAL LIGATION  04/28/2011   Procedure: POST PARTUM TUBAL LIGATION;  Surgeon: Adam Phenix, MD;  Location: [facility];  Service: Gynecology;  Laterality: Bilateral;     OB History    Gravida  5   Para  4   Term  4   Preterm  0   AB  1   Living  5     SAB  1   TAB      Ectopic      Multiple  1   Live Births  3           No family history on file.  Social History   Tobacco Use  . Smoking status: Current Every Day Smoker    Packs/day: 0.25    Types: Cigarettes  . Smokeless tobacco: Never Used  Substance Use Topics  . Alcohol use: No  . Drug use: No    Home Medications Prior to Admission medications   Medication Sig Start Date End Date Taking? Authorizing Provider  cephALEXin (KEFLEX) 500 MG capsule Take 1 capsule (500 mg total) by  mouth 4 (four) times daily for 7 days. 09/08/19 09/15/19  Maxwell Caul, PA-C  docusate sodium (COLACE) 250 MG capsule Take 1 capsule (250 mg total) by mouth daily. 01/02/18   Emi Holes, PA-C  HYDROcodone-acetaminophen (NORCO/VICODIN) 5-325 MG tablet Take 1 tablet by mouth every 6 (six) hours as needed for moderate pain. 07/18/18   Lawyer, Cristal Deer, PA-C  hydrOXYzine (ATARAX/VISTARIL) 25 MG tablet Take 1 tablet (25 mg total) by mouth every 6 (six) hours. Patient not taking: Reported on 01/02/2018 11/10/16   Bethel Born, PA-C  ibuprofen (ADVIL) 800 MG tablet Take 1 tablet (800 mg total) by mouth every 8 (eight) hours as needed. 07/18/18   Lawyer, Cristal Deer, PA-C  methocarbamol (ROBAXIN) 500 MG tablet Take 1 tablet (500 mg total) by mouth 2 (two) times daily. Patient not taking: Reported on 01/02/2018 03/19/17   Emi Holes, PA-C  metroNIDAZOLE (FLAGYL) 500 MG tablet Take 1 tablet (500 mg total) by mouth 2 (two) times daily. 01/02/18   Law, Waylan Boga, PA-C  naproxen (NAPROSYN) 500 MG tablet Take 1 tablet (500 mg total) by mouth 2 (two) times  daily. Patient not taking: Reported on 01/02/2018 03/19/17   Emi Holes, PA-C  penicillin v potassium (VEETID) 500 MG tablet Take 1 tablet (500 mg total) by mouth 4 (four) times daily. 07/18/18   Lawyer, Cristal Deer, PA-C  polyethylene glycol [facility]) packet Take 17 g by mouth daily. 01/02/18   Emi Holes, PA-C    Allergies    Patient has no known allergies.  Review of Systems   Review of Systems  Constitutional: Negative for fever.  Gastrointestinal: Positive for abdominal pain. Negative for nausea and vomiting.  Genitourinary: Positive for dysuria and hematuria.  All other systems reviewed and are negative.   Physical Exam Updated Vital Signs BP (!) 134/92   Pulse 88   Temp 98.2 F (36.8 C) (Oral)   Resp 16   Ht 5\' 4"  (1.626 m)   Wt 79.4 kg   LMP 08/23/2019   SpO2 100%   BMI 30.04 kg/m   Physical  Exam Vitals and nursing note reviewed.  Constitutional:      Appearance: She is well-developed.  HENT:     Head: Normocephalic and atraumatic.  Eyes:     General: No scleral icterus.       Right eye: No discharge.        Left eye: No discharge.     Conjunctiva/sclera: Conjunctivae normal.  Pulmonary:     Effort: Pulmonary effort is normal.  Abdominal:       Comments: Abdomen soft, nondistended.  Mild tenderness in the right mid abdomen.  No focal tenderness noted McBurney's point.  No rigidity, guarding.  No CVA tenderness.  Skin:    General: Skin is warm and dry.  Neurological:     Mental Status: She is alert.  Psychiatric:        Speech: Speech normal.        Behavior: Behavior normal.     ED Results / Procedures / Treatments   Labs (all labs ordered are listed, but only abnormal results are displayed) Labs Reviewed  URINALYSIS, ROUTINE W REFLEX MICROSCOPIC - Abnormal; Notable for the following components:      Result Value   APPearance CLOUDY (*)    Hgb urine dipstick LARGE (*)    Protein, ur 30 (*)    Leukocytes,Ua LARGE (*)    All other components within normal limits  URINALYSIS, MICROSCOPIC (REFLEX) - Abnormal; Notable for the following components:   Bacteria, UA MANY (*)    All other components within normal limits  PREGNANCY, URINE    EKG None  Radiology CT Renal Stone Study  Result Date: 09/08/2019 CLINICAL DATA:  Dysuria and hematuria. The patient suffered a fall a few days ago. EXAM: CT ABDOMEN AND PELVIS WITHOUT CONTRAST TECHNIQUE: Multidetector CT imaging of the abdomen and pelvis was performed following the standard protocol without IV contrast. COMPARISON:  CT abdomen and pelvis 01/02/2018. FINDINGS: Lower chest: Lung bases are clear with some emphysematous disease noted. No pleural or pericardial effusion. Heart size is normal. Hepatobiliary: No focal liver abnormality is seen. No gallstones, gallbladder wall thickening, or biliary dilatation.  Pancreas: Unremarkable. No pancreatic ductal dilatation or surrounding inflammatory changes. Spleen: Normal in size without focal abnormality. Adrenals/Urinary Tract: Adrenal glands are unremarkable. Kidneys are normal, without renal calculi, focal lesion, or hydronephrosis. Bladder is unremarkable. Stomach/Bowel: Stomach is within normal limits. Appendix appears normal. No evidence of bowel wall thickening, distention, or inflammatory changes. Vascular/Lymphatic: No significant vascular findings are present. No enlarged abdominal or pelvic lymph nodes. Reproductive: Uterus  and bilateral adnexa are unremarkable. Tubal ligation clips are again seen. Clip in the anterior aspect of the left lower quadrant is unchanged. Other: None. Musculoskeletal: Negative. IMPRESSION: No acute abnormality abdomen or pelvis. Negative for urinary tract stone. Unchanged tubal ligation clip in the anterior aspect of the left lower quadrant. Electronically Signed   By: Drusilla Kanner M.D.   On: 09/08/2019 15:50    Procedures Procedures (including critical care time)  Medications Ordered in ED Medications  cephALEXin (KEFLEX) capsule 500 mg (500 mg Oral Given 09/08/19 1615)    ED Course  I have reviewed the triage vital signs and the nursing notes.  Pertinent labs & imaging results that were available during my care of the patient were reviewed by me and considered in my medical decision making (see chart for details).    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  No fevers, nausea/vomiting.  Does endorse some mid right mid abdomen pain.  Initial arrival she is afebrile, nontoxic-appearing.  Vital signs are stable.  On exam, no CVA tenderness.  She does have some mid right abdominal tenderness.  No focal tenderness no McBurney's point.  Concern for GU etiology such as UTI versus kidney stone.  Urine ordered at triage.  History/physical exam not  concerning for appendicitis, diverticulitis, ovarian torsion.  Urine pregnancy negative.  UA shows large hemoglobin, large leukocytes, pyuria, bacteria.  Given concern for infected urine as well as mild abdominal tenderness, plan for renal study to ensure no stone that is contributing to her symptoms.   CT scan shows no evidence of kidney stone.  No other intra-abdominal process.  Discussed results with patient.  We will treat UTI with antibiotics.  Patient with no known drug allergies. At this time, patient exhibits no emergent life-threatening condition that require further evaluation in ED or admission. Patient had ample opportunity for questions and discussion. All patient's questions were answered with full understanding. Strict return precautions discussed. Patient expresses understanding and agreement to plan.   Portions of this note were generated with Scientist, clinical (histocompatibility and immunogenetics). Dictation errors may occur despite best attempts at proofreading.   Final Clinical Impression(s) / ED Diagnoses Final diagnoses:  Acute cystitis with hematuria    Rx / DC Orders ED Discharge Orders         Ordered    cephALEXin (KEFLEX) 500 MG capsule  4 times daily        09/08/19 1605           Rosana Hoes 09/08/19 1815    Milagros Loll, MD 09/11/19 1515

## 2019-09-08 NOTE — Discharge Instructions (Signed)
Take antibiotics as directed. Please take all of your antibiotics until finished.  Follow-up with [facility] to establish a primary care doctor if you do not have one.   Return to the Emergency Department for any worsening pain with urination, persistent blood in your urine, fever, persistent vomiting, abdominal pain or any other worsening or concerning symptoms.

## 2019-09-08 NOTE — ED Triage Notes (Signed)
Dysuria x 3 days 

## 2020-02-12 ENCOUNTER — Other Ambulatory Visit: Payer: Self-pay

## 2020-02-12 DIAGNOSIS — Z20822 Contact with and (suspected) exposure to covid-19: Secondary | ICD-10-CM

## 2020-02-13 LAB — NOVEL CORONAVIRUS, NAA: SARS-CoV-2, NAA: NOT DETECTED

## 2020-02-13 LAB — SARS-COV-2, NAA 2 DAY TAT

## 2020-02-28 ENCOUNTER — Emergency Department (HOSPITAL_BASED_OUTPATIENT_CLINIC_OR_DEPARTMENT_OTHER): Payer: Self-pay

## 2020-02-28 ENCOUNTER — Encounter (HOSPITAL_BASED_OUTPATIENT_CLINIC_OR_DEPARTMENT_OTHER): Payer: Self-pay

## 2020-02-28 ENCOUNTER — Emergency Department (HOSPITAL_BASED_OUTPATIENT_CLINIC_OR_DEPARTMENT_OTHER)
Admission: EM | Admit: 2020-02-28 | Discharge: 2020-02-29 | Disposition: A | Discharge status: Home / Self Care | Payer: Self-pay | Attending: Emergency Medicine | Admitting: Emergency Medicine

## 2020-02-28 ENCOUNTER — Other Ambulatory Visit: Payer: Self-pay

## 2020-02-28 DIAGNOSIS — M25562 Pain in left knee: Secondary | ICD-10-CM | POA: Insufficient documentation

## 2020-02-28 DIAGNOSIS — F17210 Nicotine dependence, cigarettes, uncomplicated: Secondary | ICD-10-CM | POA: Insufficient documentation

## 2020-02-28 DIAGNOSIS — G8929 Other chronic pain: Secondary | ICD-10-CM | POA: Insufficient documentation

## 2020-02-28 DIAGNOSIS — J45909 Unspecified asthma, uncomplicated: Secondary | ICD-10-CM | POA: Insufficient documentation

## 2020-02-28 DIAGNOSIS — M25561 Pain in right knee: Secondary | ICD-10-CM | POA: Insufficient documentation

## 2020-02-28 MED ORDER — KETOROLAC TROMETHAMINE 60 MG/2ML IM SOLN
60.0000 mg | Freq: Once | INTRAMUSCULAR | Status: AC
  Administered 2020-02-29: 60 mg via INTRAMUSCULAR
  Filled 2020-02-28: qty 2

## 2020-02-28 MED ORDER — NAPROXEN 375 MG PO TABS: 375.0000 mg | ORAL_TABLET | Freq: Two times a day (BID) | ORAL | 0 refills | Status: AC

## 2020-02-28 NOTE — Discharge Instructions (Addendum)
Please refer to the attached instructions. Follow-up with orthopedics as suggested. The orthopedic practice also has a walk-in urgent care clinic open 8A-8P Monday through Friday.

## 2020-02-28 NOTE — ED Provider Notes (Signed)
[facility] EMERGENCY DEPARTMENT Provider Note   CSN: [id number] Arrival date & time: 02/28/20  2219     History Chief Complaint  Patient presents with  . Knee Pain    Amy Mcdonald is a 35 y.o. female.  Patient has known ligamental damage to the right knee (original injury 12 years ago). She reports recurrent pain and swelling of the right knee. She tends to favor that knee, with increased pressure on the left knee. Today while walking, patient felt a "pop" in the left knee with pain.  The history is provided by the patient.  Knee Pain Location:  Knee Knee location:  L knee Pain details:    Quality:  Throbbing   Severity:  Moderate   Onset quality:  Sudden   Timing:  Constant Chronicity:  New Prior injury to area:  No Worsened by:  Bearing weight      Past Medical History:  Diagnosis Date  . Anemia   . Asthma     Patient Active Problem List   Diagnosis Date Noted  . Twin pregnancy, delivered vaginally, current hospitalization 04/29/2011  . Supervision of high risk pregnancy in third trimester 04/02/2011  . Twin pregnancy, antepartum 03/31/2011  . Late prenatal care 03/31/2011    Past Surgical History:  Procedure Laterality Date  . NO PAST SURGERIES    . TUBAL LIGATION  04/28/2011   Procedure: POST PARTUM TUBAL LIGATION;  Surgeon: Adam Phenix, MD;  Location: [facility];  Service: Gynecology;  Laterality: Bilateral;     OB History    Gravida  5   Para  4   Term  4   Preterm  0   AB  1   Living  5     SAB  1   IAB      Ectopic      Multiple  1   Live Births  3           No family history on file.  Social History   Tobacco Use  . Smoking status: Current Every Day Smoker    Packs/day: 0.25    Types: Cigarettes  . Smokeless tobacco: Never Used  Vaping Use  . Vaping Use: Never used  Substance Use Topics  . Alcohol use: No  . Drug use: No    Home Medications Prior to Admission medications   Medication Sig Start  Date End Date Taking? Authorizing Provider  docusate sodium (COLACE) 250 MG capsule Take 1 capsule (250 mg total) by mouth daily. 01/02/18   Emi Holes, PA-C  HYDROcodone-acetaminophen (NORCO/VICODIN) 5-325 MG tablet Take 1 tablet by mouth every 6 (six) hours as needed for moderate pain. 07/18/18   Lawyer, Cristal Deer, PA-C  hydrOXYzine (ATARAX/VISTARIL) 25 MG tablet Take 1 tablet (25 mg total) by mouth every 6 (six) hours. Patient not taking: Reported on 01/02/2018 11/10/16   Bethel Born, PA-C  ibuprofen (ADVIL) 800 MG tablet Take 1 tablet (800 mg total) by mouth every 8 (eight) hours as needed. 07/18/18   Lawyer, Cristal Deer, PA-C  methocarbamol (ROBAXIN) 500 MG tablet Take 1 tablet (500 mg total) by mouth 2 (two) times daily. Patient not taking: Reported on 01/02/2018 03/19/17   Emi Holes, PA-C  metroNIDAZOLE (FLAGYL) 500 MG tablet Take 1 tablet (500 mg total) by mouth 2 (two) times daily. 01/02/18   Law, Waylan Boga, PA-C  naproxen (NAPROSYN) 500 MG tablet Take 1 tablet (500 mg total) by mouth 2 (two) times daily. Patient not taking:  Reported on 01/02/2018 03/19/17   Emi Holes, PA-C  penicillin v potassium (VEETID) 500 MG tablet Take 1 tablet (500 mg total) by mouth 4 (four) times daily. 07/18/18   Lawyer, Cristal Deer, PA-C  polyethylene glycol [facility]) packet Take 17 g by mouth daily. 01/02/18   Emi Holes, PA-C    Allergies    Patient has no known allergies.  Review of Systems   Review of Systems  Musculoskeletal: Positive for arthralgias.  All other systems reviewed and are negative.   Physical Exam Updated Vital Signs BP (!) 141/78 (BP Location: Left Arm)   Pulse 94   Temp 98.1 F (36.7 C) (Oral)   Resp 18   Ht 5\' 3"  (1.6 m)   Wt 79.4 kg   LMP 02/14/2020   SpO2 98%   BMI 31.00 kg/m   Physical Exam Vitals and nursing note reviewed.  Constitutional:      Appearance: Normal appearance.  HENT:     Head: Normocephalic.     Mouth/Throat:      Mouth: Mucous membranes are moist.  Eyes:     Conjunctiva/sclera: Conjunctivae normal.  Cardiovascular:     Rate and Rhythm: Normal rate.  Pulmonary:     Effort: Pulmonary effort is normal.  Musculoskeletal:        General: No swelling or deformity. Normal range of motion.  Skin:    General: Skin is warm and dry.  Neurological:     Mental Status: She is alert and oriented to person, place, and time.  Psychiatric:        Mood and Affect: Mood normal.        Behavior: Behavior normal.     ED Results / Procedures / Treatments   Labs (all labs ordered are listed, but only abnormal results are displayed) Labs Reviewed - No data to display  EKG None  Radiology DG Knee Complete 4 Views Left  Result Date: 02/28/2020 CLINICAL DATA:  Injured while walking EXAM: LEFT KNEE - COMPLETE 4+ VIEW COMPARISON:  None. FINDINGS: No evidence of fracture, dislocation, or joint effusion. No evidence of arthropathy or other focal bone abnormality. Soft tissues are unremarkable. IMPRESSION: Normal radiographs. Electronically Signed   By: 04/27/2020 M.D.   On: 02/28/2020 23:23    Procedures Procedures   Medications Ordered in ED Medications  ketorolac (TORADOL) injection 60 mg (60 mg Intramuscular Given 02/29/20 0003)    ED Course  I have reviewed the triage vital signs and the nursing notes.  Pertinent labs & imaging results that were available during my care of the patient were reviewed by me and considered in my medical decision making (see chart for details).    MDM Rules/Calculators/A&P                         Patient X-Ray negative for obvious fracture or dislocation.  Pt advised to follow up with orthopedics. Patient given knee sleeve while in ED, conservative therapy recommended and discussed. Patient will be discharged home & is agreeable with above plan. Returns precautions discussed. Pt appears safe for discharge. Final Clinical Impression(s) / ED Diagnoses Final diagnoses:  Acute  pain of left knee  Chronic pain of right knee    Rx / DC Orders ED Discharge Orders         Ordered    naproxen (NAPROSYN) 375 MG tablet  2 times daily        02/28/20 2353  Felicie Morn, NP 02/29/20 0014    Charlynne Pander, MD 03/07/20 1257

## 2020-02-28 NOTE — ED Provider Notes (Incomplete)
[facility] EMERGENCY DEPARTMENT Provider Note   CSN: [id number] Arrival date & time: 02/28/20  2219     History Chief Complaint  Patient presents with  . Knee Pain    Amy Mcdonald is a 35 y.o. female.  Patient has known ligamental damage to the right knee (original injury 12 years ago). She reports recurrent pain and swelling of the right knee. She tends to favor that knee, with increased pressure on the left knee. Today while walking, patient felt a "pop" in the left knee with pain.  The history is provided by the patient.  Knee Pain Location:  Knee Knee location:  L knee Pain details:    Quality:  Throbbing   Severity:  Moderate   Onset quality:  Sudden   Timing:  Constant Chronicity:  New Prior injury to area:  No Worsened by:  Bearing weight      Past Medical History:  Diagnosis Date  . Anemia   . Asthma     Patient Active Problem List   Diagnosis Date Noted  . Twin pregnancy, delivered vaginally, current hospitalization 04/29/2011  . Supervision of high risk pregnancy in third trimester 04/02/2011  . Twin pregnancy, antepartum 03/31/2011  . Late prenatal care 03/31/2011    Past Surgical History:  Procedure Laterality Date  . NO PAST SURGERIES    . TUBAL LIGATION  04/28/2011   Procedure: POST PARTUM TUBAL LIGATION;  Surgeon: Adam Phenix, MD;  Location: [facility];  Service: Gynecology;  Laterality: Bilateral;     OB History    Gravida  5   Para  4   Term  4   Preterm  0   AB  1   Living  5     SAB  1   IAB      Ectopic      Multiple  1   Live Births  3           No family history on file.  Social History   Tobacco Use  . Smoking status: Current Every Day Smoker    Packs/day: 0.25    Types: Cigarettes  . Smokeless tobacco: Never Used  Vaping Use  . Vaping Use: Never used  Substance Use Topics  . Alcohol use: No  . Drug use: No    Home Medications Prior to Admission medications   Medication Sig Start  Date End Date Taking? Authorizing Provider  docusate sodium (COLACE) 250 MG capsule Take 1 capsule (250 mg total) by mouth daily. 01/02/18   Emi Holes, PA-C  HYDROcodone-acetaminophen (NORCO/VICODIN) 5-325 MG tablet Take 1 tablet by mouth every 6 (six) hours as needed for moderate pain. 07/18/18   Lawyer, Cristal Deer, PA-C  hydrOXYzine (ATARAX/VISTARIL) 25 MG tablet Take 1 tablet (25 mg total) by mouth every 6 (six) hours. Patient not taking: Reported on 01/02/2018 11/10/16   Bethel Born, PA-C  ibuprofen (ADVIL) 800 MG tablet Take 1 tablet (800 mg total) by mouth every 8 (eight) hours as needed. 07/18/18   Lawyer, Cristal Deer, PA-C  methocarbamol (ROBAXIN) 500 MG tablet Take 1 tablet (500 mg total) by mouth 2 (two) times daily. Patient not taking: Reported on 01/02/2018 03/19/17   Emi Holes, PA-C  metroNIDAZOLE (FLAGYL) 500 MG tablet Take 1 tablet (500 mg total) by mouth 2 (two) times daily. 01/02/18   Law, Waylan Boga, PA-C  naproxen (NAPROSYN) 500 MG tablet Take 1 tablet (500 mg total) by mouth 2 (two) times daily. Patient not taking:  Reported on 01/02/2018 03/19/17   Emi Holes, PA-C  penicillin v potassium (VEETID) 500 MG tablet Take 1 tablet (500 mg total) by mouth 4 (four) times daily. 07/18/18   Lawyer, Cristal Deer, PA-C  polyethylene glycol [facility]) packet Take 17 g by mouth daily. 01/02/18   Emi Holes, PA-C    Allergies    Patient has no known allergies.  Review of Systems   Review of Systems  Musculoskeletal: Positive for arthralgias.  All other systems reviewed and are negative.   Physical Exam Updated Vital Signs BP (!) 141/78 (BP Location: Left Arm)   Pulse 94   Temp 98.1 F (36.7 C) (Oral)   Resp 18   Ht 5\' 3"  (1.6 m)   Wt 79.4 kg   LMP 02/14/2020   SpO2 98%   BMI 31.00 kg/m   Physical Exam Vitals and nursing note reviewed.  Constitutional:      Appearance: Normal appearance.  HENT:     Head: Normocephalic.     Mouth/Throat:      Mouth: Mucous membranes are moist.  Eyes:     Conjunctiva/sclera: Conjunctivae normal.  Cardiovascular:     Rate and Rhythm: Normal rate.  Pulmonary:     Effort: Pulmonary effort is normal.  Musculoskeletal:        General: No swelling or deformity. Normal range of motion.  Skin:    General: Skin is warm and dry.  Neurological:     Mental Status: She is alert and oriented to person, place, and time.  Psychiatric:        Mood and Affect: Mood normal.        Behavior: Behavior normal.     ED Results / Procedures / Treatments   Labs (all labs ordered are listed, but only abnormal results are displayed) Labs Reviewed - No data to display  EKG None  Radiology DG Knee Complete 4 Views Left  Result Date: 02/28/2020 CLINICAL DATA:  Injured while walking EXAM: LEFT KNEE - COMPLETE 4+ VIEW COMPARISON:  None. FINDINGS: No evidence of fracture, dislocation, or joint effusion. No evidence of arthropathy or other focal bone abnormality. Soft tissues are unremarkable. IMPRESSION: Normal radiographs. Electronically Signed   By: 04/27/2020 M.D.   On: 02/28/2020 23:23    Procedures Procedures {Remember to document critical care time when appropriate:1}  Medications Ordered in ED Medications  ketorolac (TORADOL) injection 60 mg (has no administration in time range)    ED Course  I have reviewed the triage vital signs and the nursing notes.  Pertinent labs & imaging results that were available during my care of the patient were reviewed by me and considered in my medical decision making (see chart for details).    MDM Rules/Calculators/A&P                         Patient X-Ray negative for obvious fracture or dislocation.  Pt advised to follow up with orthopedics. Patient given knee sleeve while in ED, conservative therapy recommended and discussed. Patient will be discharged home & is agreeable with above plan. Returns precautions discussed. Pt appears safe for discharge. Final  Clinical Impression(s) / ED Diagnoses Final diagnoses:  Acute pain of left knee  Chronic pain of right knee    Rx / DC Orders ED Discharge Orders         Ordered    naproxen (NAPROSYN) 375 MG tablet  2 times daily  02/28/20 2353         

## 2020-02-28 NOTE — ED Triage Notes (Signed)
Pt states she felt a pop to left knee while walking at work today-NAD-limping gait

## 2022-06-28 IMAGING — DX DG KNEE COMPLETE 4+V*L*
4 series · 4 of 4 positions shown · non-contrast
Comparison: None.

CLINICAL DATA: Injured while walking

EXAM:
LEFT KNEE - COMPLETE 4+ VIEW

[knee ap]
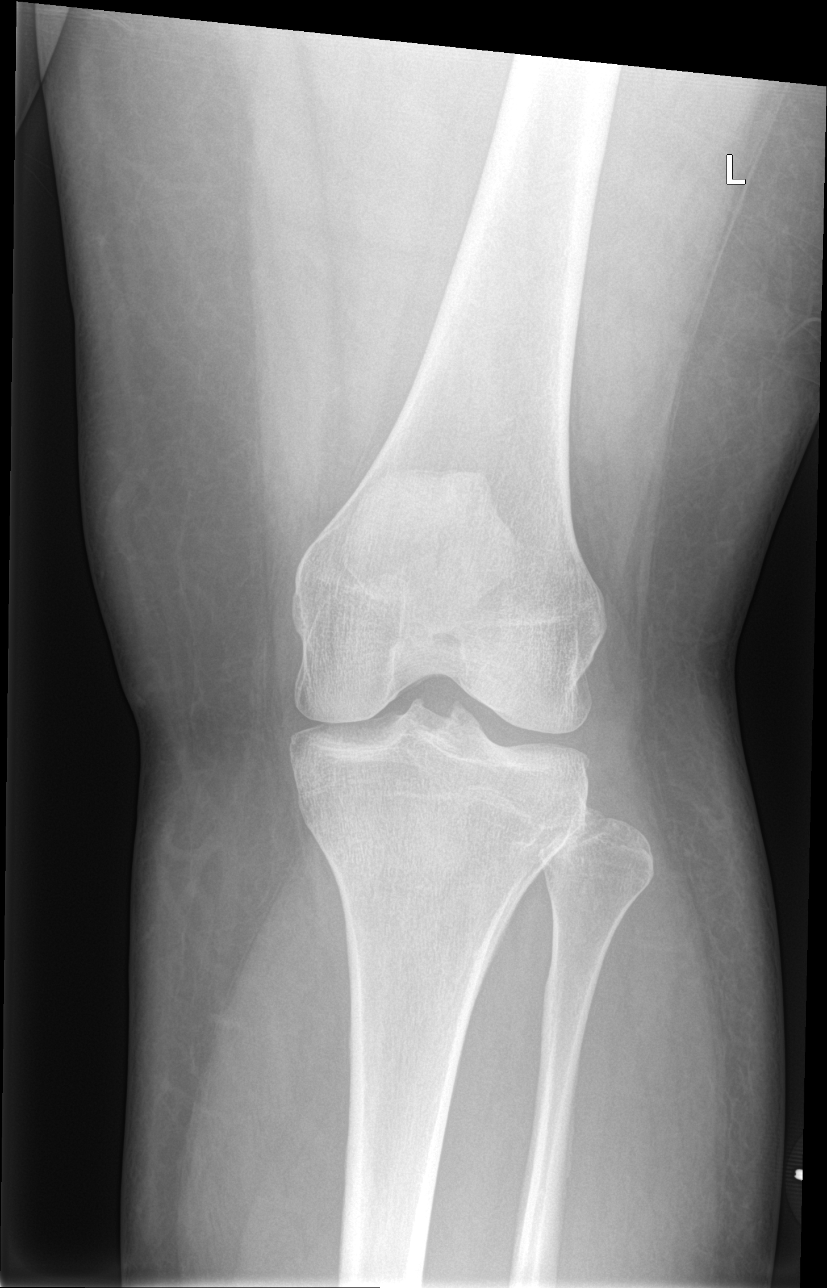

[knee obl (1 of 2)]
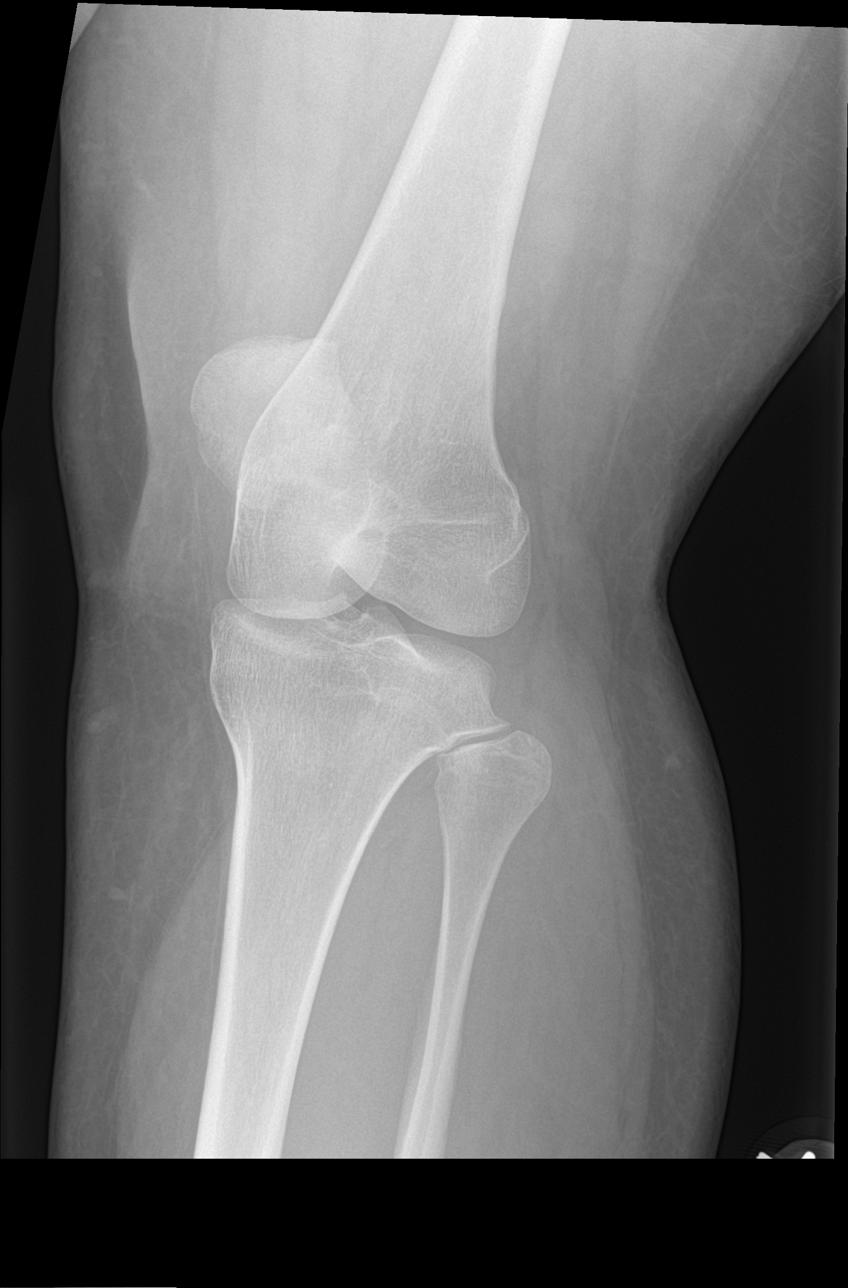

[knee obl (2 of 2)]
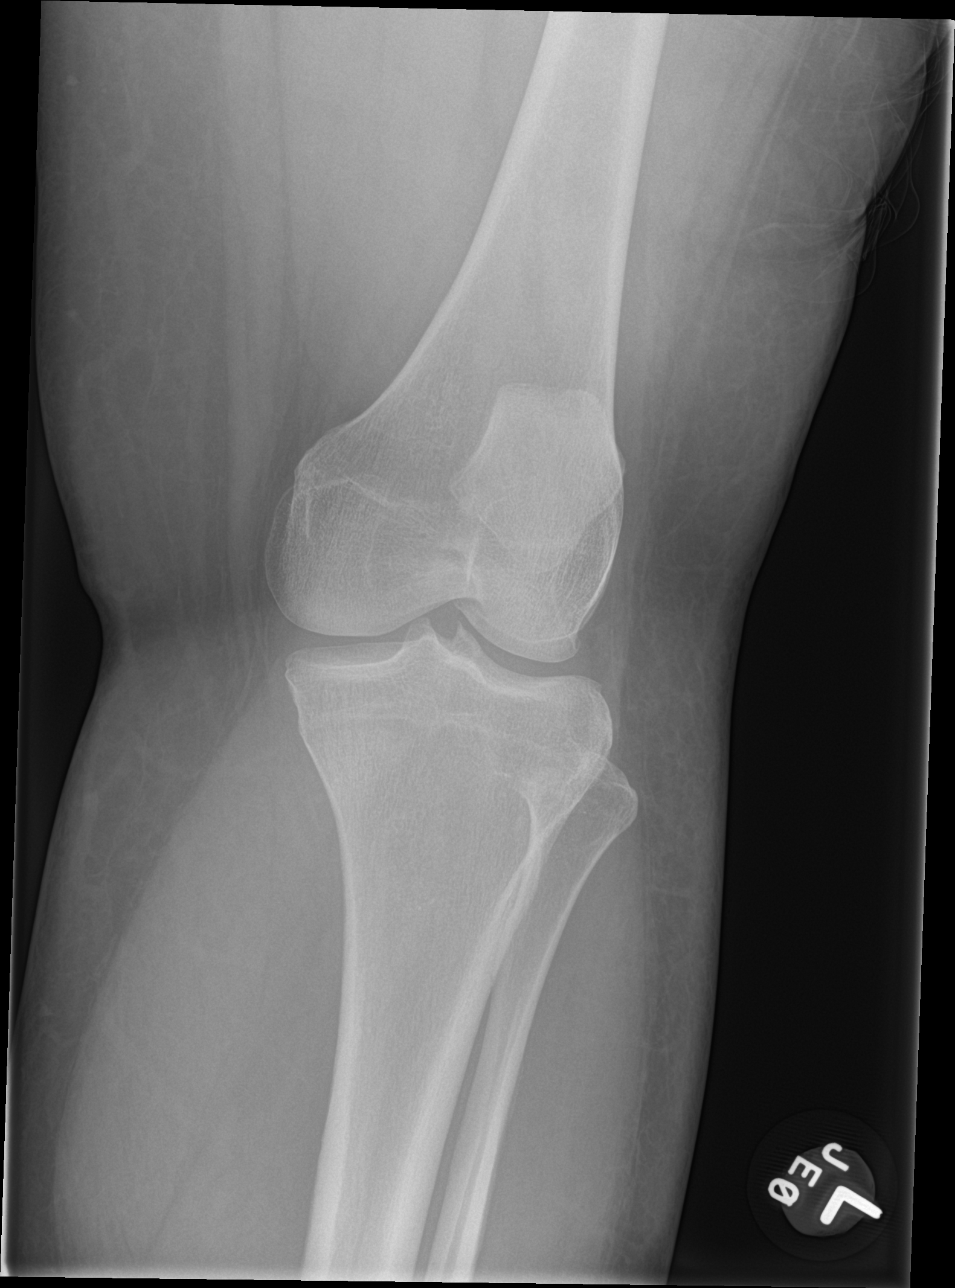

[knee lat]
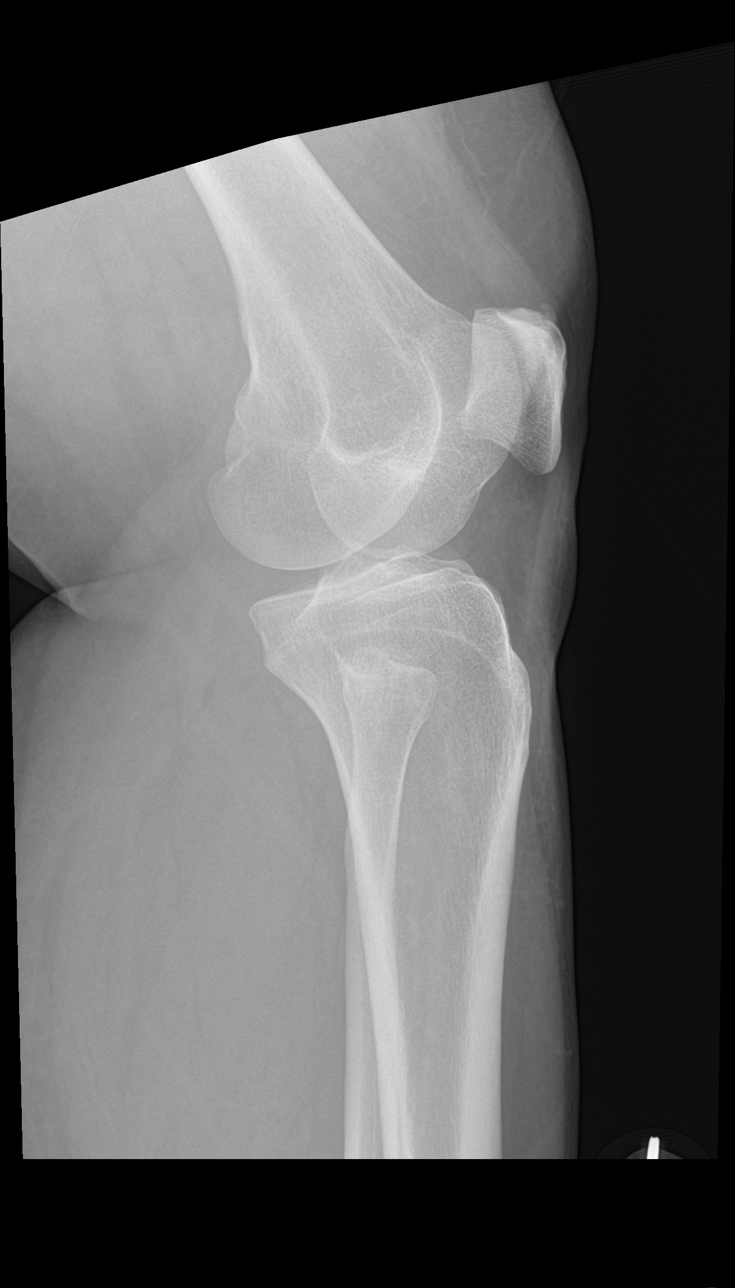

[4 of 4 positions shown; findings below may reference images not displayed]

FINDINGS: No evidence of fracture, dislocation, or joint effusion. No evidence
of arthropathy or other focal bone abnormality. Soft tissues are
unremarkable.
IMPRESSION: Normal radiographs.
